# Patient Record
Sex: Male | Born: 1950
Health system: Southern US, Community
[De-identification: ages and names within clinical notes are randomized; demographics above are authoritative.]

## PROBLEM LIST (undated history)

## (undated) DIAGNOSIS — I2510 Atherosclerotic heart disease of native coronary artery without angina pectoris: Secondary | ICD-10-CM

## (undated) DIAGNOSIS — E785 Hyperlipidemia, unspecified: Secondary | ICD-10-CM

## (undated) DIAGNOSIS — I10 Essential (primary) hypertension: Secondary | ICD-10-CM

## (undated) HISTORY — DX: Atherosclerotic heart disease of native coronary artery without angina pectoris: I25.10

## (undated) HISTORY — PX: CORONARY ANGIOPLASTY WITH STENT PLACEMENT: SHX49

## (undated) HISTORY — DX: Essential (primary) hypertension: I10

## (undated) HISTORY — DX: Hyperlipidemia, unspecified: E78.5

---

## 1997-10-13 ENCOUNTER — Inpatient Hospital Stay (HOSPITAL_COMMUNITY): Admission: AD | Admit: 1997-10-13 | Discharge: 1997-10-15 | Payer: Self-pay | Admitting: Cardiology

## 2005-10-30 ENCOUNTER — Ambulatory Visit: Payer: Self-pay | Admitting: Cardiology

## 2005-12-19 ENCOUNTER — Ambulatory Visit: Payer: Self-pay | Admitting: Cardiology

## 2006-02-16 ENCOUNTER — Ambulatory Visit: Payer: Self-pay | Admitting: Cardiology

## 2006-04-18 ENCOUNTER — Ambulatory Visit: Payer: Self-pay | Admitting: Cardiology

## 2006-11-30 ENCOUNTER — Ambulatory Visit: Payer: Self-pay | Admitting: Cardiology

## 2006-11-30 LAB — CONVERTED CEMR LAB
ALT: 25 units/L (ref 0–53)
Albumin: 3.9 g/dL (ref 3.5–5.2)
Alkaline Phosphatase: 57 units/L (ref 39–117)
BUN: 12 mg/dL (ref 6–23)
Bilirubin, Direct: 0.1 mg/dL (ref 0.0–0.3)
Calcium: 9.2 mg/dL (ref 8.4–10.5)
GFR calc Af Amer: 113 mL/min
GFR calc non Af Amer: 93 mL/min
LDL Cholesterol: 83 mg/dL (ref 0–99)
Total CHOL/HDL Ratio: 4.5
VLDL: 21 mg/dL (ref 0–40)

## 2008-02-28 ENCOUNTER — Ambulatory Visit: Payer: Self-pay | Admitting: Cardiology

## 2008-03-02 ENCOUNTER — Ambulatory Visit: Payer: Self-pay | Admitting: Cardiology

## 2008-03-02 LAB — CONVERTED CEMR LAB
ALT: 25 units/L (ref 0–53)
HDL: 36.3 mg/dL — ABNORMAL LOW (ref >39.0)
LDL Cholesterol: 87 mg/dL (ref 0–99)
Total Bilirubin: 0.8 mg/dL (ref 0.3–1.2)
Total CHOL/HDL Ratio: 3.8
VLDL: 14 mg/dL (ref 0–40)

## 2008-12-03 ENCOUNTER — Encounter (INDEPENDENT_AMBULATORY_CARE_PROVIDER_SITE_OTHER): Payer: Self-pay | Admitting: *Deleted

## 2009-04-15 DIAGNOSIS — I10 Essential (primary) hypertension: Secondary | ICD-10-CM | POA: Insufficient documentation

## 2009-04-15 DIAGNOSIS — E785 Hyperlipidemia, unspecified: Secondary | ICD-10-CM

## 2009-04-15 DIAGNOSIS — I2510 Atherosclerotic heart disease of native coronary artery without angina pectoris: Secondary | ICD-10-CM | POA: Insufficient documentation

## 2009-04-16 ENCOUNTER — Ambulatory Visit: Payer: Self-pay | Admitting: Cardiology

## 2009-08-25 ENCOUNTER — Telehealth: Payer: Self-pay | Admitting: Cardiology

## 2009-11-11 ENCOUNTER — Telehealth: Payer: Self-pay | Admitting: Cardiology

## 2010-03-18 ENCOUNTER — Encounter: Payer: Self-pay | Admitting: Cardiology

## 2010-05-10 NOTE — Progress Notes (Signed)
  Medications Added METOPROLOL TARTRATE 25 MG TABS (METOPROLOL TARTRATE) Take one tablet by mouth twice a day            Additional Follow-up for Phone Call Additional follow up Details #2::    pt was wanting to switch to metoprolol tart because of cost puposes is this ok with you....  I called pharmacy and gave pt lopressor 25mg  two times a day 1 year supply Follow-up by: Kem Parkinson,  November 11, 2009 4:28 PM  Additional Follow-up for Phone Call Additional follow up Details #3:: Details for Additional Follow-up Action Taken: dc toprol; begin lopressor 25 mg by mouth two times a day Ferman Hamming, MD, [facility]  November 12, 2009 10:25 AM   New/Updated Medications: METOPROLOL TARTRATE 25 MG TABS (METOPROLOL TARTRATE) Take one tablet by mouth twice a day

## 2010-05-10 NOTE — Assessment & Plan Note (Signed)
Summary: f1y/kfw  Medications Added ASPIRIN 81 MG TBEC (ASPIRIN) Take one tablet by mouth daily      Allergies Added: ! PCN  Visit Type:  1 Yr F/U   History of Present Illness: Mr. Kirk Black is a pleasant gentleman who has a history of coronary disease status post PCI of his LAD in July 1999.  I last saw him in November of 2009. Since then the patient denies any dyspnea on exertion, orthopnea, PND, pedal edema, palpitations, syncope or chest pain.   Current Medications (verified): 1)  Toprol Xl 50 Mg Xr24h-Tab (Metoprolol Succinate) .Marland Kitchen.. 1 Tab By Mouth Once Daily 2)  Simvastatin 40 Mg Tabs (Simvastatin) .... Take One Tablet By Mouth Daily At Bedtime 3)  Lisinopril 20 Mg Tabs (Lisinopril) .... Take One Tablet By Mouth Daily 4)  Aspirin 81 Mg Tbec (Aspirin) .... Take One Tablet By Mouth Daily  Allergies (verified): 1)  ! Pcn  Past History:  Past Medical History: Reviewed history from 04/15/2009 and no changes required. Current Problems:  HYPERTENSION (ICD-401.9) HYPERLIPIDEMIA (ICD-272.4) CAD (ICD-414.00)  Past Surgical History: Reviewed history from 04/15/2009 and no changes required.  status post PCI of his LAD in July 1999  Social History: Reviewed history from 04/15/2009 and no changes required. Tobacco Use - No.   Review of Systems       no fevers or chills, productive cough, hemoptysis, dysphasia, odynophagia, melena, hematochezia, dysuria, hematuria, rash, seizure activity, orthopnea, PND, pedal edema, claudication. Remaining systems are negative.   Vital Signs:  Patient profile:   60 year old male Height:      71 inches Weight:      184 pounds BMI:     25.76 Pulse rate:   64 / minute BP sitting:   128 / 75  (left arm) Cuff size:   large  Vitals Entered By: Cyndi Lennert. Graham-Oliver  Physical Exam  General:  Well-developed well-nourished in no acute distress.  Skin is warm and dry.  HEENT is normal.  Neck is supple. No thyromegaly.  Chest is  clear to auscultation with normal expansion.  Cardiovascular exam is regular rate and rhythm.  Abdominal exam nontender or distended. No masses palpated. Extremities show no edema. neuro grossly intact    EKG  Procedure date:  04/16/2009  Findings:      Sinus rhythm at a rate of 64. Axis normal. No ST changes.  Impression & Recommendations:  Problem # 1:  HYPERTENSION (ICD-401.9)  Blood pressure controlled on present medications. Will continue. Check Bmet. His updated medication list for this problem includes:    Toprol Xl 50 Mg Xr24h-tab (Metoprolol succinate) .Marland Kitchen... 1 tab by mouth once daily    Lisinopril 20 Mg Tabs (Lisinopril) .Marland Kitchen... Take one tablet by mouth daily    Aspirin 81 Mg Tbec (Aspirin) .Marland Kitchen... Take one tablet by mouth daily  His updated medication list for this problem includes:    Toprol Xl 50 Mg Xr24h-tab (Metoprolol succinate) .Marland Kitchen... 1 tab by mouth once daily    Lisinopril 20 Mg Tabs (Lisinopril) .Marland Kitchen... Take one tablet by mouth daily  Problem # 2:  HYPERLIPIDEMIA (ICD-272.4) Continue statin. Check lipids and liver. His updated medication list for this problem includes:    Simvastatin 40 Mg Tabs (Simvastatin) .Marland Kitchen... Take one tablet by mouth daily at bedtime  His updated medication list for this problem includes:    Simvastatin 40 Mg Tabs (Simvastatin) .Marland Kitchen... Take one tablet by mouth daily at bedtime  Problem # 3:  CAD (ICD-414.00) Continue  aspirin, beta blocker, ACE inhibitor and statin. I again discussed a possible stress test with the patient but he is not interested. He understands the risk of undiagnosed coronary disease including myocardial infarction and death. Continue risk factor modification including diet and exercise. His updated medication list for this problem includes:    Toprol Xl 50 Mg Xr24h-tab (Metoprolol succinate) .Marland Kitchen... 1 tab by mouth once daily    Lisinopril 20 Mg Tabs (Lisinopril) .Marland Kitchen... Take one tablet by mouth daily    Aspirin 81 Mg Tbec  (Aspirin) .Marland Kitchen... Take one tablet by mouth daily  Patient Instructions: 1)  Your physician recommends that you schedule a follow-up appointment in: ONE YEAR 2)  Your physician recommends that you return for lab work WHEN FASTING

## 2010-05-10 NOTE — Progress Notes (Signed)
Summary: cardiac clearance for preemployment   Phone Note From Other Clinic   Caller: beverly Reason for Call: Need Referral Information Summary of Call: Per [facility] pt needs clearance for pre employment clearance. ofc [id number] fax [phone]. Pt is in the office needs the clearance today if possible.  Initial call taken by: Edman Circle,  Aug 25, 2009 11:29 AM  Follow-up for Phone Call        pt having a pre-employment physical and they want to know from a cardiac stand point if the pt has any work restrictions regarding his heart. will foward to dr Jens Som for his review Deliah Goody, RN  Aug 25, 2009 12:05 PM   Additional Follow-up for Phone Call Additional follow up Details #1::        No restrictions Ferman Hamming, MD, [facility]  Aug 25, 2009 2:59 PM   this note and last office note faxed Deliah Goody, RN  Aug 25, 2009 3:03 PM

## 2010-08-23 NOTE — Assessment & Plan Note (Signed)
[facility]                            CARDIOLOGY OFFICE NOTE   Kirk Black                      MRN:          [id number]  DATE:02/28/2008                            DOB:          29-Nov-1950    Mr. Kirk Black is a pleasant gentleman who has a history of  coronary disease status post PCI of his LAD in July 1999.  Since I last  saw him on November 30, 2006, he has done well with no dyspnea, chest  pain, palpitations, or syncope.  There is no pedal edema.  He does not  smoke and he is exercising and following a diet.   MEDICATIONS:  1. Toprol 25 mg p.o. daily.  2. Lisinopril 20 mg p.o. daily.  3. Zocor 40 mg p.o. daily.  4. Aspirin 81 mg p.o. daily.   PHYSICAL EXAMINATION:  VITAL SIGNS:  His blood pressure of 115/77 and  his pulse is 66.  His weight is 182 pounds.  HEENT:  Normal.  NECK:  Supple.  There are no bruits noted.  CHEST:  Clear.  CARDIOVASCULAR:  Regular rate and rhythm.  ABDOMEN:  No tenderness.  EXTREMITIES:  No edema.   His electrocardiogram shows sinus rhythm at a rate of 64.  The axis is  normal.  No ST changes noted.   DIAGNOSES:  1. Coronary artery disease - the patient will continue on his aspirin,      statin, beta-blocker, and angiotensin-converting enzyme inhibitor.      He will also continue with diet and exercise.  We discussed again a      positive stress Myoview, but he declined.  2. Hyperlipidemia - he will continue on his statin.  We will check      lipids and liver, adjust as indicated.  Note, also check a BMET      given his angiotensin-converting enzyme inhibitor use.  3. Hypertension - his blood pressure is adequately controlled with his      present medications.   We will see him back in 1 year.     Kirk Frieze Kirk Som, MD, [facility]  Electronically Signed   BSC/MedQ  DD: 02/28/2008  DT: 02/29/2008  Job #: [id number]   cc:   Kirk Black, M.D.

## 2010-08-23 NOTE — Assessment & Plan Note (Signed)
[facility]                            CARDIOLOGY OFFICE NOTE   NAME:Breithaupt, 11/30/2006                  MRN:          [id number]  DATE:11/30/2006                            DOB:          1950-12-30    Mr. Kirk Black is a very pleasant gentleman who has a history of coronary  disease.  He has had prior PCI of his LAD in July 1999.  Since I last  saw him he is doing extremely well.  He denies any dyspnea, chest pain,  palpitations or syncope.  He is walking routinely and trying to follow a  diet.  He is a nonsmoker.   MEDICATIONS:  1. Aspirin 325 mg p.o. daily.  2. Toprol 25 mg daily.  3. Lisinopril 20 mg p.o. daily.  4. Zocor 40 m p.o. daily.   Physical exam today shows a blood pressure of 122/71 and his pulse is  60.  He weighs 185 pounds.  HEENT:  Normal.  NECK:  Supple with no bruits.  CHEST:  Clear.  CARDIOVASCULAR:  Regular rate and rhythm.  ABDOMEN:  Benign.  EXTREMITIES:  No edema.   His electrocardiogram shows a sinus rhythm at a rate of 54.  There are  no ST changes noted.   DIAGNOSES:  1. Coronary artery disease, status post percutaneous coronary      intervention of the left anterior descending.  We will continue      medical therapy as he has had no chest pain or shortness of breath.      We discussed the possibility of a stress Myoview, but he is not      interested in this as on previous visits.  He will continue his      aspirin, statin, beta blocker and ACE inhibitor.  2. Hyperlipidemia.  Will check lipids and liver today and adjust with      goal LDL of less than 70.  3. Hypertension.  His blood pressure is well-controlled on his current      medication.  I will check a BMET to follow his potassium and renal      function.   We will see him back in 1 year.     Madolyn Frieze Jens Som, MD, [facility]  Electronically Signed    BSC/MedQ  DD: 11/30/2006  DT: 12/01/2006  Job #: [id number]   cc:   Windle Guard, M.D.

## 2010-08-26 NOTE — Assessment & Plan Note (Signed)
Kirk Black                              CARDIOLOGY OFFICE NOTE   Kirk Black, Kirk Black                      MRN:          [id number]  DATE:10/30/2005                            DOB:          [birth year]    Kirk Black returns for followup today.  He has a history of coronary  disease, and has had prior percutaneous coronary intervention of his left  anterior descending.  Since I last saw him, he is doing well, with no  dyspnea on exertion, orthopnea, PND, pedal edema, palpitations, pre-syncope,  syncope or chest pain.  He is exercising routinely, walking 30-40 minutes  daily, and also is following a diet.  He does not smoke.   MEDICATIONS:  1.  Aspirin 325 mg p.o. daily.  2.  Lipitor 40 mg p.o. q. day.  3.  Altace 10 mg p.o. q. day.  4.  Toprol 25 mg p.o. q. day.   PHYSICAL EXAMINATION:  BLOOD PRESSURE:  112/68.  PULSE:  65.  NECK:  Supple, no bruits.  CHEST:  Clear.  CARDIOVASCULAR:  Regular rate and rhythm.  ABDOMEN:  Shows no pulsatile masses and no bruits.  EXTREMITIES:  No edema.   His electrocardiogram shows a normal sinus rhythm at a rate of 60.  There  are no ST changes noted.   DIAGNOSIS:  1.  Coronary artery disease, status post percutaneous coronary intervention      of the left anterior descending.  2.  Hyperlipidemia.  3.  Hypertension.   PLAN:  Kirk Black is doing well from a symptomatic standpoint with no  chest pain or shortness of breath.  He will continue on his diet and  exercise, and note, he does not smoke.  For financial reasons, we are  discontinuing his Lipitor and Altace, and we will begin simvastatin 40 mg  p.o. nightly and lisinopril 20 mg p.o. daily.  He will continue on his  aspirin and his  metoprolol ER at 25 mg p.o. q. day.  We will plan to have him return for a  CMET and lipids in 6 weeks.  I will see him back in 12 months.                              Madolyn Frieze Jens Som, MD, [facility]    BSC/MedQ  DD:  10/30/2005  DT:  10/30/2005  Job #:  [id number]   cc:   Windle Guard, MD

## 2011-01-03 ENCOUNTER — Other Ambulatory Visit: Payer: Self-pay | Admitting: Cardiology

## 2011-01-05 ENCOUNTER — Other Ambulatory Visit: Payer: Self-pay | Admitting: Cardiology

## 2011-04-06 ENCOUNTER — Other Ambulatory Visit: Payer: Self-pay | Admitting: Cardiology

## 2011-05-01 ENCOUNTER — Encounter: Payer: Self-pay | Admitting: *Deleted

## 2011-05-05 ENCOUNTER — Ambulatory Visit: Payer: Self-pay | Admitting: Cardiology

## 2011-05-08 ENCOUNTER — Other Ambulatory Visit: Payer: Self-pay | Admitting: Cardiology

## 2011-05-08 MED ORDER — METOPROLOL TARTRATE 25 MG PO TABS: 25.0000 mg | ORAL_TABLET | Freq: Two times a day (BID) | ORAL | Status: DC

## 2011-06-16 ENCOUNTER — Ambulatory Visit: Payer: Self-pay | Admitting: Cardiology

## 2011-08-04 ENCOUNTER — Encounter: Payer: Self-pay | Admitting: Cardiology

## 2011-08-04 ENCOUNTER — Ambulatory Visit (INDEPENDENT_AMBULATORY_CARE_PROVIDER_SITE_OTHER): Payer: Self-pay | Admitting: Cardiology

## 2011-08-04 VITALS — BP 124/70 | HR 56 | Ht 71.0 in | Wt 185.0 lb

## 2011-08-04 DIAGNOSIS — I10 Essential (primary) hypertension: Secondary | ICD-10-CM

## 2011-08-04 DIAGNOSIS — I2510 Atherosclerotic heart disease of native coronary artery without angina pectoris: Secondary | ICD-10-CM

## 2011-08-04 DIAGNOSIS — E785 Hyperlipidemia, unspecified: Secondary | ICD-10-CM

## 2011-08-04 NOTE — Progress Notes (Signed)
   HPI: Mr. Kirk Black is a pleasant gentleman who has a history of coronary disease status post PCI of his LAD in July 1999. Patient has declined function studies in the past. I last saw him in Jan 2011. Since then the patient denies any dyspnea on exertion, orthopnea, PND, pedal edema, palpitations, syncope or chest pain.  Current Outpatient Prescriptions  Medication Sig Dispense Refill  . aspirin 81 MG tablet Take 160 mg by mouth daily.      Marland Kitchen lisinopril (PRINIVIL,ZESTRIL) 20 MG tablet TAKE ONE TABLET BY MOUTH DAILY  90 tablet  1  . metoprolol tartrate (LOPRESSOR) 25 MG tablet Take 1 tablet (25 mg total) by mouth 2 (two) times daily.  60 tablet  2  . simvastatin (ZOCOR) 40 MG tablet TAKE  ONE TABLET BY MOUTH DAILY AT BEDTIME  30 tablet  12     Past Medical History  Diagnosis Date  . Coronary artery disease   . Hypertension   . Hyperlipidemia     Past Surgical History  Procedure Date  . Coronary angioplasty with stent placement     percutaneous coronary intervention (PCI) of LAD    History   Social History  . Marital Status: Single    Spouse Name: N/A    Number of Children: N/A  . Years of Education: N/A   Occupational History  . Not on file.   Social History Main Topics  . Smoking status: Never Smoker   . Smokeless tobacco: Not on file  . Alcohol Use: Not on file  . Drug Use: Not on file  . Sexually Active: Not on file   Other Topics Concern  . Not on file   Social History Narrative  . No narrative on file    ROS: no fevers or chills, productive cough, hemoptysis, dysphasia, odynophagia, melena, hematochezia, dysuria, hematuria, rash, seizure activity, orthopnea, PND, pedal edema, claudication. Remaining systems are negative.  Physical Exam: Well-developed well-nourished in no acute distress.  Skin is warm and dry.  HEENT is normal.  Neck is supple.  Chest is clear to auscultation with normal expansion.  Cardiovascular exam is regular rate and rhythm.   Abdominal exam nontender or distended. No masses palpated. Extremities show no edema. neuro grossly intact  ECG sinus bradycardia at a rate of 56. No significant ST changes.

## 2011-08-04 NOTE — Assessment & Plan Note (Signed)
Continue statin. Check lipids and liver. 

## 2011-08-04 NOTE — Assessment & Plan Note (Signed)
Blood pressure controlled. Continue present medications. Check potassium and renal function. 

## 2011-08-04 NOTE — Assessment & Plan Note (Signed)
Continue aspirin and statin. Continue risk factor modification. Patient declines a functional study.  

## 2011-08-04 NOTE — Patient Instructions (Signed)
Your physician wants you to follow-up in: ONE YEAR WITH DR Shelda Pal will receive a reminder letter in the mail two months in advance. If you don't receive a letter, please call our office to schedule the follow-up appointment.   Your physician recommends that you WHEN FASTING

## 2011-08-07 ENCOUNTER — Other Ambulatory Visit: Payer: Self-pay | Admitting: Cardiology

## 2011-09-06 ENCOUNTER — Encounter: Payer: Self-pay | Admitting: *Deleted

## 2011-09-06 ENCOUNTER — Other Ambulatory Visit (INDEPENDENT_AMBULATORY_CARE_PROVIDER_SITE_OTHER): Payer: Self-pay

## 2011-09-06 DIAGNOSIS — I2510 Atherosclerotic heart disease of native coronary artery without angina pectoris: Secondary | ICD-10-CM

## 2011-09-06 DIAGNOSIS — E785 Hyperlipidemia, unspecified: Secondary | ICD-10-CM

## 2011-09-06 LAB — LIPID PANEL
Cholesterol: 133 mg/dL (ref 0–200)
LDL Cholesterol: 78 mg/dL (ref 0–99)
Total CHOL/HDL Ratio: 3
Triglycerides: 65 mg/dL (ref 0.0–149.0)
VLDL: 13 mg/dL (ref 0.0–40.0)

## 2011-09-06 LAB — HEPATIC FUNCTION PANEL
ALT: 22 U/L (ref 0–53)
AST: 25 U/L (ref 0–37)
Bilirubin, Direct: 0.1 mg/dL (ref 0.0–0.3)
Total Bilirubin: 0.6 mg/dL (ref 0.3–1.2)

## 2012-01-06 ENCOUNTER — Other Ambulatory Visit: Payer: Self-pay | Admitting: Cardiology

## 2012-08-07 ENCOUNTER — Other Ambulatory Visit: Payer: Self-pay | Admitting: Cardiology

## 2012-09-19 ENCOUNTER — Encounter: Payer: Self-pay | Admitting: Cardiology

## 2012-11-06 ENCOUNTER — Other Ambulatory Visit: Payer: Self-pay | Admitting: *Deleted

## 2012-11-06 MED ORDER — LISINOPRIL 20 MG PO TABS: ORAL_TABLET | ORAL | Status: DC

## 2013-01-11 ENCOUNTER — Other Ambulatory Visit: Payer: Self-pay | Admitting: Cardiology

## 2013-01-14 ENCOUNTER — Other Ambulatory Visit: Payer: Self-pay

## 2013-01-14 MED ORDER — LISINOPRIL 20 MG PO TABS: ORAL_TABLET | ORAL | Status: DC

## 2013-01-15 ENCOUNTER — Ambulatory Visit (INDEPENDENT_AMBULATORY_CARE_PROVIDER_SITE_OTHER): Payer: Self-pay | Admitting: Cardiology

## 2013-01-15 ENCOUNTER — Encounter: Payer: Self-pay | Admitting: Cardiology

## 2013-01-15 VITALS — BP 120/80 | HR 73 | Ht 71.0 in | Wt 183.8 lb

## 2013-01-15 DIAGNOSIS — I2510 Atherosclerotic heart disease of native coronary artery without angina pectoris: Secondary | ICD-10-CM

## 2013-01-15 NOTE — Assessment & Plan Note (Signed)
Blood pressure controlled. Continue present medications. Check potassium and renal function. 

## 2013-01-15 NOTE — Assessment & Plan Note (Signed)
Continue aspirin and statin. Continue risk factor modification. Patient declines a functional study.

## 2013-01-15 NOTE — Progress Notes (Signed)
      HPI: Kirk Black is a pleasant gentleman who has a history of coronary disease status post PCI of his LAD in July 1999 for fu. Patient has declined function studies in the past. I last saw him in April 2013. Since then the patient denies any dyspnea on exertion, orthopnea, PND, pedal edema, palpitations, syncope or chest pain.   Current Outpatient Prescriptions  Medication Sig Dispense Refill  . aspirin 81 MG tablet Take 81 mg by mouth daily.       Marland Kitchen lisinopril (PRINIVIL,ZESTRIL) 20 MG tablet TAKE ONE TABLET BY MOUTH DAILY  90 tablet  1  . metoprolol tartrate (LOPRESSOR) 25 MG tablet TAKE ONE TABLET TWICE DAILY  60 tablet  11  . simvastatin (ZOCOR) 40 MG tablet TAKE ONE TABLET BY MOUTH DAILY AT BEDTIME  90 tablet  0   No current facility-administered medications for this visit.     Past Medical History  Diagnosis Date  . Coronary artery disease   . Hypertension   . Hyperlipidemia     Past Surgical History  Procedure Laterality Date  . Coronary angioplasty with stent placement      percutaneous coronary intervention (PCI) of LAD    History   Social History  . Marital Status: Single    Spouse Name: N/A    Number of Children: N/A  . Years of Education: N/A   Occupational History  . Not on file.   Social History Main Topics  . Smoking status: Never Smoker   . Smokeless tobacco: Not on file  . Alcohol Use: Not on file  . Drug Use: Not on file  . Sexual Activity: Not on file   Other Topics Concern  . Not on file   Social History Narrative  . No narrative on file    ROS: no fevers or chills, productive cough, hemoptysis, dysphasia, odynophagia, melena, hematochezia, dysuria, hematuria, rash, seizure activity, orthopnea, PND, pedal edema, claudication. Remaining systems are negative.  Physical Exam: Well-developed well-nourished in no acute distress.  Skin is warm and dry.  HEENT is normal.  Neck is supple.  Chest is clear to auscultation with  normal expansion.  Cardiovascular exam is regular rate and rhythm.  Abdominal exam nontender or distended. No masses palpated. Extremities show no edema. neuro grossly intact  ECG sinus rhythm with no ST changes.

## 2013-01-15 NOTE — Assessment & Plan Note (Signed)
Continue statin.check lipids and liver. 

## 2013-01-15 NOTE — Patient Instructions (Signed)
Your physician wants you to follow-up in: ONE YEAR WITH DR Shelda Pal will receive a reminder letter in the mail two months in advance. If you don't receive a letter, please call our office to schedule the follow-up appointment.   Your physician recommends that you return for lab work WHEN ABLE= DO NOT EAT PRIOR TO LAB WORK

## 2013-02-18 ENCOUNTER — Encounter: Payer: Self-pay | Admitting: *Deleted

## 2013-02-18 ENCOUNTER — Encounter (INDEPENDENT_AMBULATORY_CARE_PROVIDER_SITE_OTHER): Payer: Self-pay

## 2013-02-18 ENCOUNTER — Other Ambulatory Visit (INDEPENDENT_AMBULATORY_CARE_PROVIDER_SITE_OTHER): Payer: Self-pay

## 2013-02-18 DIAGNOSIS — I2510 Atherosclerotic heart disease of native coronary artery without angina pectoris: Secondary | ICD-10-CM

## 2013-02-18 LAB — BASIC METABOLIC PANEL
BUN: 12 mg/dL (ref 6–23)
Calcium: 8.6 mg/dL (ref 8.4–10.5)
Creatinine, Ser: 0.9 mg/dL (ref 0.4–1.5)
GFR: 94.99 mL/min (ref >60.00)
Glucose, Bld: 102 mg/dL — ABNORMAL HIGH (ref 70–99)

## 2013-02-18 LAB — HEPATIC FUNCTION PANEL
ALT: 25 U/L (ref 0–53)
AST: 25 U/L (ref 0–37)
Bilirubin, Direct: 0.1 mg/dL (ref 0.0–0.3)
Total Bilirubin: 0.6 mg/dL (ref 0.3–1.2)
Total Protein: 6.3 g/dL (ref 6.0–8.3)

## 2013-02-18 LAB — LIPID PANEL
Cholesterol: 132 mg/dL (ref 0–200)
HDL: 41.5 mg/dL (ref >39.00)

## 2013-04-12 ENCOUNTER — Other Ambulatory Visit: Payer: Self-pay | Admitting: Cardiology

## 2013-08-19 ENCOUNTER — Other Ambulatory Visit: Payer: Self-pay | Admitting: Cardiology

## 2013-10-23 ENCOUNTER — Other Ambulatory Visit: Payer: Self-pay | Admitting: Cardiology

## 2013-10-24 NOTE — Telephone Encounter (Signed)
Rx refill sent to patient pharmacy   

## 2013-11-13 ENCOUNTER — Other Ambulatory Visit: Payer: Self-pay | Admitting: Cardiology

## 2014-02-20 ENCOUNTER — Other Ambulatory Visit: Payer: Self-pay | Admitting: *Deleted

## 2014-02-20 MED ORDER — LISINOPRIL 20 MG PO TABS: ORAL_TABLET | ORAL | Status: DC

## 2014-03-31 ENCOUNTER — Other Ambulatory Visit: Payer: Self-pay | Admitting: *Deleted

## 2014-03-31 MED ORDER — LISINOPRIL 20 MG PO TABS: ORAL_TABLET | ORAL | Status: DC

## 2014-04-24 ENCOUNTER — Other Ambulatory Visit: Payer: Self-pay | Admitting: *Deleted

## 2014-04-24 MED ORDER — SIMVASTATIN 40 MG PO TABS: 40.0000 mg | ORAL_TABLET | Freq: Every day | ORAL | Status: DC

## 2014-05-12 NOTE — Progress Notes (Signed)
      HPI: FU coronary disease status post PCI of his LAD in July 1999. Patient has declined function studies in the past. Since I last saw him, the patient denies any dyspnea on exertion, orthopnea, PND, pedal edema, palpitations, syncope or chest pain.   Current Outpatient Prescriptions  Medication Sig Dispense Refill  . aspirin 81 MG tablet Take 81 mg by mouth daily.     Marland Kitchen. lisinopril (PRINIVIL,ZESTRIL) 20 MG tablet TAKE ONE TABLET BY MOUTH ONE TIME DAILY 30 tablet 1  . metoprolol tartrate (LOPRESSOR) 25 MG tablet TAKE ONE TABLET TWICE DAILY 60 tablet 6  . simvastatin (ZOCOR) 40 MG tablet Take 1 tablet (40 mg total) by mouth at bedtime. 30 tablet 0   No current facility-administered medications for this visit.     Past Medical History  Diagnosis Date  . Coronary artery disease   . Hypertension   . Hyperlipidemia     Past Surgical History  Procedure Laterality Date  . Coronary angioplasty with stent placement      percutaneous coronary intervention (PCI) of LAD    History   Social History  . Marital Status: Single    Spouse Name: N/A    Number of Children: N/A  . Years of Education: N/A   Occupational History  . Not on file.   Social History Main Topics  . Smoking status: Never Smoker   . Smokeless tobacco: Not on file  . Alcohol Use: Not on file  . Drug Use: Not on file  . Sexual Activity: Not on file   Other Topics Concern  . Not on file   Social History Narrative    ROS: no fevers or chills, productive cough, hemoptysis, dysphasia, odynophagia, melena, hematochezia, dysuria, hematuria, rash, seizure activity, orthopnea, PND, pedal edema, claudication. Remaining systems are negative.  Physical Exam: Well-developed well-nourished in no acute distress.  Skin is warm and dry.  HEENT is normal.  Neck is supple.  Chest is clear to auscultation with normal expansion.  Cardiovascular exam is regular rate and rhythm.  Abdominal exam nontender or distended.  No masses palpated. Extremities show no edema. neuro grossly intact  ECG sinus rhythm at a rate of 64. No ST changes.

## 2014-05-14 ENCOUNTER — Encounter: Payer: Self-pay | Admitting: Cardiology

## 2014-05-14 ENCOUNTER — Ambulatory Visit (INDEPENDENT_AMBULATORY_CARE_PROVIDER_SITE_OTHER): Payer: 59 | Admitting: Cardiology

## 2014-05-14 DIAGNOSIS — I2510 Atherosclerotic heart disease of native coronary artery without angina pectoris: Secondary | ICD-10-CM

## 2014-05-14 NOTE — Assessment & Plan Note (Signed)
Continue statin. Check lipids and liver. 

## 2014-05-14 NOTE — Assessment & Plan Note (Signed)
Continue aspirin and statin. Declines functional study. 

## 2014-05-14 NOTE — Assessment & Plan Note (Signed)
Blood pressure controlled. Continue present medications. Check potassium and renal function. 

## 2014-05-14 NOTE — Patient Instructions (Signed)
Your physician wants you to follow-up in: ONE YEAR WITH DR CRENSHAW You will receive a reminder letter in the mail two months in advance. If you don't receive a letter, please call our office to schedule the follow-up appointment.   Your physician recommends that you return for lab work WHEN FASTING 

## 2014-05-22 ENCOUNTER — Other Ambulatory Visit: Payer: Self-pay | Admitting: *Deleted

## 2014-05-22 MED ORDER — LISINOPRIL 20 MG PO TABS: ORAL_TABLET | ORAL | Status: DC

## 2014-05-22 MED ORDER — SIMVASTATIN 40 MG PO TABS: 40.0000 mg | ORAL_TABLET | Freq: Every day | ORAL | Status: DC

## 2014-05-25 ENCOUNTER — Other Ambulatory Visit: Payer: Self-pay | Admitting: Nurse Practitioner

## 2014-05-25 LAB — BASIC METABOLIC PANEL WITH GFR
BUN: 11 mg/dL (ref 6–23)
CALCIUM: 9 mg/dL (ref 8.4–10.5)
CO2: 29 mEq/L (ref 19–32)
CREATININE: 0.83 mg/dL (ref 0.50–1.35)
Chloride: 106 mEq/L (ref 96–112)
Glucose, Bld: 101 mg/dL — ABNORMAL HIGH (ref 70–99)
Potassium: 4.1 mEq/L (ref 3.5–5.3)
Sodium: 141 mEq/L (ref 135–145)

## 2014-05-25 LAB — HEPATIC FUNCTION PANEL
ALBUMIN: 3.9 g/dL (ref 3.5–5.2)
ALK PHOS: 80 U/L (ref 39–117)
ALT: 27 U/L (ref 0–53)
AST: 25 U/L (ref 0–37)
BILIRUBIN DIRECT: 0.1 mg/dL (ref 0.0–0.3)
BILIRUBIN INDIRECT: 0.4 mg/dL (ref 0.2–1.2)
Total Bilirubin: 0.5 mg/dL (ref 0.2–1.2)
Total Protein: 6 g/dL (ref 6.0–8.3)

## 2014-05-25 LAB — LIPID PANEL
Cholesterol: 117 mg/dL (ref 0–200)
HDL: 30 mg/dL — AB (ref >39)
LDL Cholesterol: 73 mg/dL (ref 0–99)
TRIGLYCERIDES: 69 mg/dL (ref <150)
Total CHOL/HDL Ratio: 3.9 Ratio
VLDL: 14 mg/dL (ref 0–40)

## 2014-05-27 ENCOUNTER — Encounter: Payer: Self-pay | Admitting: *Deleted

## 2014-10-22 ENCOUNTER — Other Ambulatory Visit: Payer: Self-pay | Admitting: *Deleted

## 2014-10-22 MED ORDER — METOPROLOL TARTRATE 25 MG PO TABS: 25.0000 mg | ORAL_TABLET | Freq: Two times a day (BID) | ORAL | Status: DC

## 2015-04-26 ENCOUNTER — Other Ambulatory Visit: Payer: Self-pay

## 2015-04-26 MED ORDER — METOPROLOL TARTRATE 25 MG PO TABS: 25.0000 mg | ORAL_TABLET | Freq: Two times a day (BID) | ORAL | Status: DC

## 2015-04-26 NOTE — Telephone Encounter (Signed)
Lewayne BuntingBrian S Crenshaw, MD at 05/12/2014 5:54 PM  metoprolol tartrate (LOPRESSOR) 25 MG tabletTAKE ONE TABLET TWICE DAILY Patient Instructions     Your physician wants you to follow-up in: ONE YEAR WITH DR Shelda PalRENSHAW You will receive a reminder letter in the mail two months in advance. If you don't receive a letter, please call our office to schedule the follow-up appointment.   Your physician recommends that you return for lab work WHEN FASTING

## 2015-05-24 ENCOUNTER — Other Ambulatory Visit: Payer: Self-pay | Admitting: *Deleted

## 2015-05-24 MED ORDER — LISINOPRIL 20 MG PO TABS: ORAL_TABLET | ORAL | Status: DC

## 2015-05-24 MED ORDER — SIMVASTATIN 40 MG PO TABS: 40.0000 mg | ORAL_TABLET | Freq: Every day | ORAL | Status: DC

## 2015-05-26 ENCOUNTER — Other Ambulatory Visit: Payer: Self-pay | Admitting: *Deleted

## 2015-05-26 MED ORDER — METOPROLOL TARTRATE 25 MG PO TABS: 25.0000 mg | ORAL_TABLET | Freq: Two times a day (BID) | ORAL | Status: DC

## 2015-06-14 NOTE — Progress Notes (Signed)
      HPI: FU coronary disease status post PCI of his LAD in July 1999. Patient has declined function studies in the past. Since I last saw him,   Current Outpatient Prescriptions  Medication Sig Dispense Refill  . aspirin 81 MG tablet Take 81 mg by mouth daily.     Marland Kitchen. lisinopril (PRINIVIL,ZESTRIL) 20 MG tablet TAKE ONE TABLET BY MOUTH ONE TIME DAILY 30 tablet 0  . metoprolol tartrate (LOPRESSOR) 25 MG tablet Take 1 tablet (25 mg total) by mouth 2 (two) times daily. 60 tablet 0  . simvastatin (ZOCOR) 40 MG tablet Take 1 tablet (40 mg total) by mouth at bedtime. 30 tablet 0   No current facility-administered medications for this visit.     Past Medical History  Diagnosis Date  . Coronary artery disease   . Hypertension   . Hyperlipidemia     Past Surgical History  Procedure Laterality Date  . Coronary angioplasty with stent placement      percutaneous coronary intervention (PCI) of LAD    Social History   Social History  . Marital Status: Single    Spouse Name: N/A  . Number of Children: N/A  . Years of Education: N/A   Occupational History  . Not on file.   Social History Main Topics  . Smoking status: Never Smoker   . Smokeless tobacco: Not on file  . Alcohol Use: Not on file  . Drug Use: Not on file  . Sexual Activity: Not on file   Other Topics Concern  . Not on file   Social History Narrative    Family History  Problem Relation Age of Onset  . Stroke Father     CVA  . Heart attack Father   . Heart attack Mother     ROS: no fevers or chills, productive cough, hemoptysis, dysphasia, odynophagia, melena, hematochezia, dysuria, hematuria, rash, seizure activity, orthopnea, PND, pedal edema, claudication. Remaining systems are negative.  Physical Exam: Well-developed well-nourished in no acute distress.  Skin is warm and dry.  HEENT is normal.  Neck is supple.  Chest is clear to auscultation with normal expansion.  Cardiovascular exam is regular  rate and rhythm.  Abdominal exam nontender or distended. No masses palpated. Extremities show no edema. neuro grossly intact  ECG Sinus rhythm with no ST changes.

## 2015-06-22 ENCOUNTER — Other Ambulatory Visit: Payer: Self-pay | Admitting: Cardiology

## 2015-06-22 ENCOUNTER — Other Ambulatory Visit: Payer: Self-pay | Admitting: *Deleted

## 2015-06-22 ENCOUNTER — Encounter: Payer: Self-pay | Admitting: Cardiology

## 2015-06-22 ENCOUNTER — Ambulatory Visit (INDEPENDENT_AMBULATORY_CARE_PROVIDER_SITE_OTHER): Payer: 59 | Admitting: Cardiology

## 2015-06-22 VITALS — BP 122/74 | HR 60 | Ht 70.0 in | Wt 168.0 lb

## 2015-06-22 DIAGNOSIS — I10 Essential (primary) hypertension: Secondary | ICD-10-CM | POA: Diagnosis not present

## 2015-06-22 DIAGNOSIS — I2510 Atherosclerotic heart disease of native coronary artery without angina pectoris: Secondary | ICD-10-CM

## 2015-06-22 MED ORDER — SIMVASTATIN 40 MG PO TABS: 40.0000 mg | ORAL_TABLET | Freq: Every day | ORAL | Status: DC

## 2015-06-22 NOTE — Assessment & Plan Note (Addendum)
Declines high-dose statins. Continue Zocor 40 mg daily. Check lipids and liver.

## 2015-06-22 NOTE — Patient Instructions (Signed)
Labwork:  Your physician recommends that you return for lab work PRIOR TO EATING  Follow-Up:  Your physician wants you to follow-up in: ONE YEAR WITH DR Shelda PalRENSHAW You will receive a reminder letter in the mail two months in advance. If you don't receive a letter, please call our office to schedule the follow-up appointment.

## 2015-06-22 NOTE — Assessment & Plan Note (Signed)
Blood pressure controlled. Continue present medications. Check potassium and renal function. 

## 2015-06-22 NOTE — Assessment & Plan Note (Signed)
Continue aspirin and statin. Declines functional study.

## 2015-06-22 NOTE — Telephone Encounter (Signed)
REFILL 

## 2015-06-25 ENCOUNTER — Encounter: Payer: Self-pay | Admitting: *Deleted

## 2015-06-25 LAB — HEPATIC FUNCTION PANEL
ALBUMIN: 4.1 g/dL (ref 3.6–5.1)
ALK PHOS: 58 U/L (ref 40–115)
ALT: 16 U/L (ref 9–46)
AST: 19 U/L (ref 10–35)
BILIRUBIN DIRECT: 0.1 mg/dL (ref <=0.2)
BILIRUBIN TOTAL: 0.7 mg/dL (ref 0.2–1.2)
Indirect Bilirubin: 0.6 mg/dL (ref 0.2–1.2)
Total Protein: 6.1 g/dL (ref 6.1–8.1)

## 2015-06-25 LAB — BASIC METABOLIC PANEL
BUN: 12 mg/dL (ref 7–25)
CHLORIDE: 107 mmol/L (ref 98–110)
CO2: 26 mmol/L (ref 20–31)
CREATININE: 0.8 mg/dL (ref 0.70–1.25)
Calcium: 8.9 mg/dL (ref 8.6–10.3)
GLUCOSE: 102 mg/dL — AB (ref 65–99)
Potassium: 4.4 mmol/L (ref 3.5–5.3)
Sodium: 142 mmol/L (ref 135–146)

## 2015-06-25 LAB — LIPID PANEL
Cholesterol: 138 mg/dL (ref 125–200)
HDL: 40 mg/dL (ref >=40)
LDL CALC: 83 mg/dL (ref <130)
Total CHOL/HDL Ratio: 3.5 Ratio (ref <=5.0)
Triglycerides: 73 mg/dL (ref <150)
VLDL: 15 mg/dL (ref <30)

## 2016-04-18 ENCOUNTER — Ambulatory Visit: Payer: 59 | Admitting: Cardiology

## 2016-06-15 ENCOUNTER — Other Ambulatory Visit: Payer: Self-pay | Admitting: Cardiology

## 2016-12-22 NOTE — Progress Notes (Signed)
      HPI: FU coronary disease status post PCI of his LAD in July 1999. Patient has declined function studies in the past. Since I last saw him, the patient denies any dyspnea on exertion, orthopnea, PND, pedal edema, palpitations, syncope or chest pain.   Current Outpatient Prescriptions  Medication Sig Dispense Refill  . aspirin 81 MG tablet Take 81 mg by mouth daily.     Marland Kitchen lisinopril (PRINIVIL,ZESTRIL) 20 MG tablet TAKE ONE TABLET BY MOUTH ONE TIME DAILY 90 tablet 3  . metoprolol tartrate (LOPRESSOR) 25 MG tablet TAKE 1 TABLET (25 MG TOTAL) BY MOUTH 2 (TWO) TIMES DAILY. 180 tablet 3  . simvastatin (ZOCOR) 40 MG tablet TAKE 1 TABLET (40 MG TOTAL) BY MOUTH AT BEDTIME. 90 tablet 3   No current facility-administered medications for this visit.      Past Medical History:  Diagnosis Date  . Coronary artery disease   . Hyperlipidemia   . Hypertension     Past Surgical History:  Procedure Laterality Date  . CORONARY ANGIOPLASTY WITH STENT PLACEMENT     percutaneous coronary intervention (PCI) of LAD    Social History   Social History  . Marital status: Single    Spouse name: N/A  . Number of children: N/A  . Years of education: N/A   Occupational History  . Not on file.   Social History Main Topics  . Smoking status: Never Smoker  . Smokeless tobacco: Never Used  . Alcohol use No  . Drug use: No  . Sexual activity: Not on file   Other Topics Concern  . Not on file   Social History Narrative  . No narrative on file    Family History  Problem Relation Age of Onset  . Stroke Father        CVA  . Heart attack Father   . Heart attack Mother     ROS: no fevers or chills, productive cough, hemoptysis, dysphasia, odynophagia, melena, hematochezia, dysuria, hematuria, rash, seizure activity, orthopnea, PND, pedal edema, claudication. Remaining systems are negative.  Physical Exam: Well-developed well-nourished in no acute distress.  Skin is warm and dry.  HEENT  is normal.  Neck is supple. No bruits. Chest is clear to auscultation with normal expansion.  Cardiovascular exam is regular rate and rhythm.  Abdominal exam nontender or distended. No masses palpated. Extremities show no edema. neuro grossly intact  ECG- sinus bradycardia, no ST changes. personally reviewed  A/P  1 Coronary artery disease-continue aspirin and statin. Patient has not had chest pain. He has declined follow-up functional study.   2 hypertension-blood pressure is controlled. Continue present medications. Check potassium and renal function.  3 hyperlipidemia-continue Zocor 40 mg daily. Check lipids and liver. Patient declines high-dose statin.  Olga Millers, MD

## 2017-01-05 ENCOUNTER — Encounter: Payer: Self-pay | Admitting: Cardiology

## 2017-01-05 ENCOUNTER — Ambulatory Visit (INDEPENDENT_AMBULATORY_CARE_PROVIDER_SITE_OTHER): Payer: 59 | Admitting: Cardiology

## 2017-01-05 VITALS — BP 110/86 | HR 57 | Ht 70.0 in | Wt 183.4 lb

## 2017-01-05 DIAGNOSIS — I10 Essential (primary) hypertension: Secondary | ICD-10-CM | POA: Diagnosis not present

## 2017-01-05 DIAGNOSIS — I2510 Atherosclerotic heart disease of native coronary artery without angina pectoris: Secondary | ICD-10-CM

## 2017-01-05 DIAGNOSIS — E785 Hyperlipidemia, unspecified: Secondary | ICD-10-CM

## 2017-01-05 NOTE — Patient Instructions (Signed)
Medication Instructions: Continue current medications  Labwork: Fasting Lipids Liver and BMPF  Procedures/Testing: None Ordered  Follow-Up: Your physician wants you to follow-up in: 1 Year. You will receive a reminder letter in the mail two months in advance. If you don't receive a letter, please call our office to schedule the follow-up appointment.   Any Additional Special Instructions Will Be Listed Below (If Applicable).     If you need a refill on your cardiac medications before your next appointment, please call your pharmacy.

## 2017-01-08 ENCOUNTER — Emergency Department (HOSPITAL_COMMUNITY)
Admission: EM | Admit: 2017-01-08 | Discharge: 2017-01-08 | Disposition: A | Discharge status: Home / Self Care | Payer: 59 | Attending: Physician Assistant | Admitting: Physician Assistant

## 2017-01-08 ENCOUNTER — Emergency Department (HOSPITAL_COMMUNITY): Payer: 59

## 2017-01-08 ENCOUNTER — Encounter (HOSPITAL_COMMUNITY): Payer: Self-pay

## 2017-01-08 DIAGNOSIS — I10 Essential (primary) hypertension: Secondary | ICD-10-CM | POA: Diagnosis not present

## 2017-01-08 DIAGNOSIS — I2510 Atherosclerotic heart disease of native coronary artery without angina pectoris: Secondary | ICD-10-CM | POA: Diagnosis not present

## 2017-01-08 DIAGNOSIS — Z955 Presence of coronary angioplasty implant and graft: Secondary | ICD-10-CM | POA: Insufficient documentation

## 2017-01-08 DIAGNOSIS — Z7982 Long term (current) use of aspirin: Secondary | ICD-10-CM | POA: Insufficient documentation

## 2017-01-08 DIAGNOSIS — Z79899 Other long term (current) drug therapy: Secondary | ICD-10-CM | POA: Diagnosis not present

## 2017-01-08 DIAGNOSIS — R42 Dizziness and giddiness: Secondary | ICD-10-CM | POA: Diagnosis not present

## 2017-01-08 LAB — BASIC METABOLIC PANEL
Anion gap: 8 (ref 5–15)
BUN: 11 mg/dL (ref 6–20)
CHLORIDE: 105 mmol/L (ref 101–111)
CO2: 22 mmol/L (ref 22–32)
CREATININE: 0.88 mg/dL (ref 0.61–1.24)
Calcium: 8.7 mg/dL — ABNORMAL LOW (ref 8.9–10.3)
GFR calc Af Amer: >60 mL/min (ref >60)
GLUCOSE: 119 mg/dL — AB (ref 65–99)
POTASSIUM: 3.8 mmol/L (ref 3.5–5.1)
SODIUM: 135 mmol/L (ref 135–145)

## 2017-01-08 LAB — CBC
HEMATOCRIT: 39.1 % (ref 39.0–52.0)
Hemoglobin: 13.2 g/dL (ref 13.0–17.0)
MCH: 29.2 pg (ref 26.0–34.0)
MCHC: 33.8 g/dL (ref 30.0–36.0)
MCV: 86.5 fL (ref 78.0–100.0)
PLATELETS: 173 10*3/uL (ref 150–400)
RBC: 4.52 MIL/uL (ref 4.22–5.81)
RDW: 12.8 % (ref 11.5–15.5)
WBC: 11.8 10*3/uL — AB (ref 4.0–10.5)

## 2017-01-08 MED ORDER — SODIUM CHLORIDE 0.9 % IV BOLUS (SEPSIS)
1000.0000 mL | Freq: Once | INTRAVENOUS | Status: AC
Start: 2017-01-08 — End: 2017-01-08
  Administered 2017-01-08: 1000 mL via INTRAVENOUS

## 2017-01-08 NOTE — ED Provider Notes (Signed)
[facility]-EMERGENCY DEPT Provider Note   CSN: [id number] Arrival date & time: 01/08/17  1352     History   Chief Complaint Chief Complaint  Patient presents with  . Dizziness    HPI Kirk Black is a 66 y.o. male.  HPI   Patient 66 year old male past medical history significant for CAD hypertension hyperlipidemia presenting today with orthostatic dizziness. Patient reports that he went to a meeting today and when he stood up he abruptly felt unsteady. Denies chest pain shortness of breath. He just reports he was unsteady. He sat down and felt much better. He stood up another time and felt the same symptoms again. Patient reports a rocking him to come to an EMS Center where he felt completely fine. They brought him here to the emergency department. He reports the symptoms are completely gone now. Orthostatic vital signs were checked and he said he was asymptomatic during that time.  Wife reports that he has had a cough for the last 2 weeks. Otherwise report eating and drinking normally. No changes in medications, and no symptoms  Past Medical History:  Diagnosis Date  . Coronary artery disease   . Hyperlipidemia   . Hypertension     Patient Active Problem List   Diagnosis Date Noted  . HYPERLIPIDEMIA 04/15/2009  . Essential hypertension 04/15/2009  . Coronary atherosclerosis 04/15/2009    Past Surgical History:  Procedure Laterality Date  . CORONARY ANGIOPLASTY WITH STENT PLACEMENT     percutaneous coronary intervention (PCI) of LAD       Home Medications    Prior to Admission medications   Medication Sig Start Date End Date Taking? Authorizing Provider  aspirin 81 MG tablet Take 81 mg by mouth daily.     [provider]  lisinopril (PRINIVIL,ZESTRIL) 20 MG tablet TAKE ONE TABLET BY MOUTH ONE TIME DAILY 06/15/16   Lewayne Bunting, MD  metoprolol tartrate (LOPRESSOR) 25 MG tablet TAKE 1 TABLET (25 MG TOTAL) BY MOUTH 2 (TWO) TIMES DAILY. 06/15/16   Lewayne Bunting, MD  simvastatin (ZOCOR) 40 MG tablet TAKE 1 TABLET (40 MG TOTAL) BY MOUTH AT BEDTIME. 06/15/16   Crenshaw, Madolyn Frieze, MD    Family History Family History  Problem Relation Age of Onset  . Stroke Father        CVA  . Heart attack Father   . Heart attack Mother     Social History Social History  Substance Use Topics  . Smoking status: Never Smoker  . Smokeless tobacco: Never Used  . Alcohol use No     Allergies   Penicillins   Review of Systems Review of Systems  Constitutional: Negative for activity change and fever.  Eyes: Negative for visual disturbance.  Respiratory: Negative for cough and shortness of breath.   Cardiovascular: Negative for chest pain.  Gastrointestinal: Positive for nausea. Negative for vomiting.  Genitourinary: Negative for dysuria.  Musculoskeletal: Negative for back pain.  Skin: Negative for rash.  Neurological: Positive for light-headedness. Negative for dizziness, syncope, weakness and headaches.  All other systems reviewed and are negative.    Physical Exam Updated Vital Signs BP (!) 143/86 (BP Location: Right Arm)   Pulse 69   Temp 98 F (36.7 C) (Oral)   Resp 16   SpO2 98%   Physical Exam  Constitutional: He is oriented to person, place, and time. He appears well-nourished.  HENT:  Head: Normocephalic.  Eyes: Conjunctivae are normal.  Cardiovascular: Normal rate.   Pulmonary/Chest: Effort  normal and breath sounds normal.  Abdominal: Soft. Bowel sounds are normal. He exhibits no distension.  Neurological: He is oriented to person, place, and time. No cranial nerve deficit.  Equal strength bilaterally upper and lower extremities negative pronator drift. Normal sensation bilaterally. Speech comprehensible, no slurring. Facial nerve tested and appears grossly normal. Alert and oriented 3.  Finger to nose normal, Ataxic gait.    Skin: Skin is warm and dry. He is not diaphoretic.  Psychiatric: He has a normal mood and affect.  His behavior is normal.     ED Treatments / Results  Labs (all labs ordered are listed, but only abnormal results are displayed) Labs Reviewed  BASIC METABOLIC PANEL - Abnormal; Notable for the following:       Result Value   Glucose, Bld 119 (*)    Calcium 8.7 (*)    All other components within normal limits  CBC - Abnormal; Notable for the following:    WBC 11.8 (*)    All other components within normal limits  I-STAT TROPONIN, ED  I-STAT BETA HCG BLOOD, ED ([facility], [facility], [facility] ONLY)    EKG  EKG Interpretation  Date/Time:  Monday January 08 2017 14:04:42 EDT Ventricular Rate:  68 PR Interval:  156 QRS Duration: 106 QT Interval:  434 QTC Calculation: 461 R Axis:   20 Text Interpretation:  Normal sinus rhythm with sinus arrhythmia Possible Lateral infarct , age undetermined Abnormal ECG Normal sinus rhythm Confirmed by Corlis Leak, Tahiri Shareef ([id number]) on 01/08/2017 2:37:07 PM       Radiology No results found.  Procedures Procedures (including critical care time)  Medications Ordered in ED Medications  sodium chloride 0.9 % bolus 1,000 mL (1,000 mLs Intravenous New Bag/Given 01/08/17 1426)     Initial Impression / Assessment and Plan / ED Course  I have reviewed the triage vital signs and the nursing notes.  Pertinent labs & imaging results that were available during my care of the patient were reviewed by me and considered in my medical decision making (see chart for details).    Patient 66 year old male past medical history significant for CAD hypertension hyperlipidemia presenting today with orthostatic dizziness. Patient reports that he went to a meeting today and when he stood up he abruptly felt unsteady. Denies chest pain shortness of breath. He just reports he was unsteady. He sat down and felt much better. He stood up another time and felt the same symptoms again. Patient reports a rocking him to come to an EMS Center where he felt completely fine. They brought him here to  the emergency department. He reports the symptoms are completely gone now. Orthostatic vital signs were checked and he said he was asymptomatic during that time.  Wife reports that he has had a cough for the last 2 weeks. Otherwise report eating and drinking normally.  2:56 PM We'll do lab work, given liter fluid. Thinks this represents orthostatic dizziness. + risk factors for stroke including CAD, HTN HLD. Patient has no risk factors for pulmonary embolism and vital signs are normal. Patient had no chest pain therefore do not suspect ischemic cardiac cause.  After fluids, patent still has the slightistataxia on walking. Finger to nose normal. MRI ordered  4:49 PM  Symptoms are completely resolved. Patient does not want to do MRI. I think this is reasonable decision will have him follow-up with an outpatient. Discussed the fact that we might be missing a stroke, family (his wife) in agreement with discharge home. Patient has  steady ambulation, likely this orthostatic dizziness.  Final Clinical Impressions(s) / ED Diagnoses   Final diagnoses:  None    New Prescriptions New Prescriptions   No medications on file     Abelino Derrick, MD 01/08/17 1651

## 2017-01-08 NOTE — ED Notes (Signed)
Pt returned from xray

## 2017-01-08 NOTE — ED Notes (Signed)
Patient transported to X-ray 

## 2017-01-08 NOTE — ED Notes (Signed)
Pt denies dizziness at this time.

## 2017-01-08 NOTE — ED Triage Notes (Signed)
Pt presents for evaluation of dizziness around 0930 today. Pt states he felt fine prior to event, was in meeting and went to stand up when he felt acutely dizzy. Pt reports resolved at this time.

## 2017-01-08 NOTE — ED Notes (Signed)
Advised pt and family that MD would like pt to wait to eat or drink anything until MRI is completed

## 2017-01-08 NOTE — Discharge Instructions (Signed)
Please drink plenty of fluids, follow-up with your primary care physician. Return with worsening symptoms, or other concerns.

## 2017-01-08 NOTE — ED Notes (Signed)
Pt stable, ambulatory, states understanding of discharge instructions 

## 2017-01-15 LAB — I-STAT BETA HCG BLOOD, ED (MC, WL, AP ONLY)

## 2017-01-15 LAB — I-STAT TROPONIN, ED: Troponin i, poc: 0 ng/mL (ref 0.00–0.08)

## 2017-01-26 LAB — BASIC METABOLIC PANEL
BUN/Creatinine Ratio: 11 (ref 10–24)
BUN: 10 mg/dL (ref 8–27)
CALCIUM: 8.8 mg/dL (ref 8.6–10.2)
CHLORIDE: 106 mmol/L (ref 96–106)
CO2: 23 mmol/L (ref 20–29)
CREATININE: 0.87 mg/dL (ref 0.76–1.27)
GFR, EST AFRICAN AMERICAN: 105 mL/min/{1.73_m2} (ref >59)
GFR, EST NON AFRICAN AMERICAN: 91 mL/min/{1.73_m2} (ref >59)
Glucose: 102 mg/dL — ABNORMAL HIGH (ref 65–99)
Potassium: 4.5 mmol/L (ref 3.5–5.2)
Sodium: 143 mmol/L (ref 134–144)

## 2017-01-26 LAB — LIPID PANEL
CHOL/HDL RATIO: 3.3 ratio (ref 0.0–5.0)
Cholesterol, Total: 134 mg/dL (ref 100–199)
HDL: 41 mg/dL (ref >39)
LDL CALC: 79 mg/dL (ref 0–99)
Triglycerides: 70 mg/dL (ref 0–149)
VLDL CHOLESTEROL CAL: 14 mg/dL (ref 5–40)

## 2017-01-29 ENCOUNTER — Encounter: Payer: Self-pay | Admitting: *Deleted

## 2017-06-10 ENCOUNTER — Other Ambulatory Visit: Payer: Self-pay | Admitting: Cardiology

## 2017-06-12 NOTE — Telephone Encounter (Signed)
Rx(s) sent to pharmacy electronically.  

## 2018-02-06 IMAGING — CR DG CHEST 2V
2 series · 2 of 2 positions shown · non-contrast
Comparison: None.

CLINICAL DATA: Chest pain

EXAM:
CHEST  2 VIEW

[chest pa]
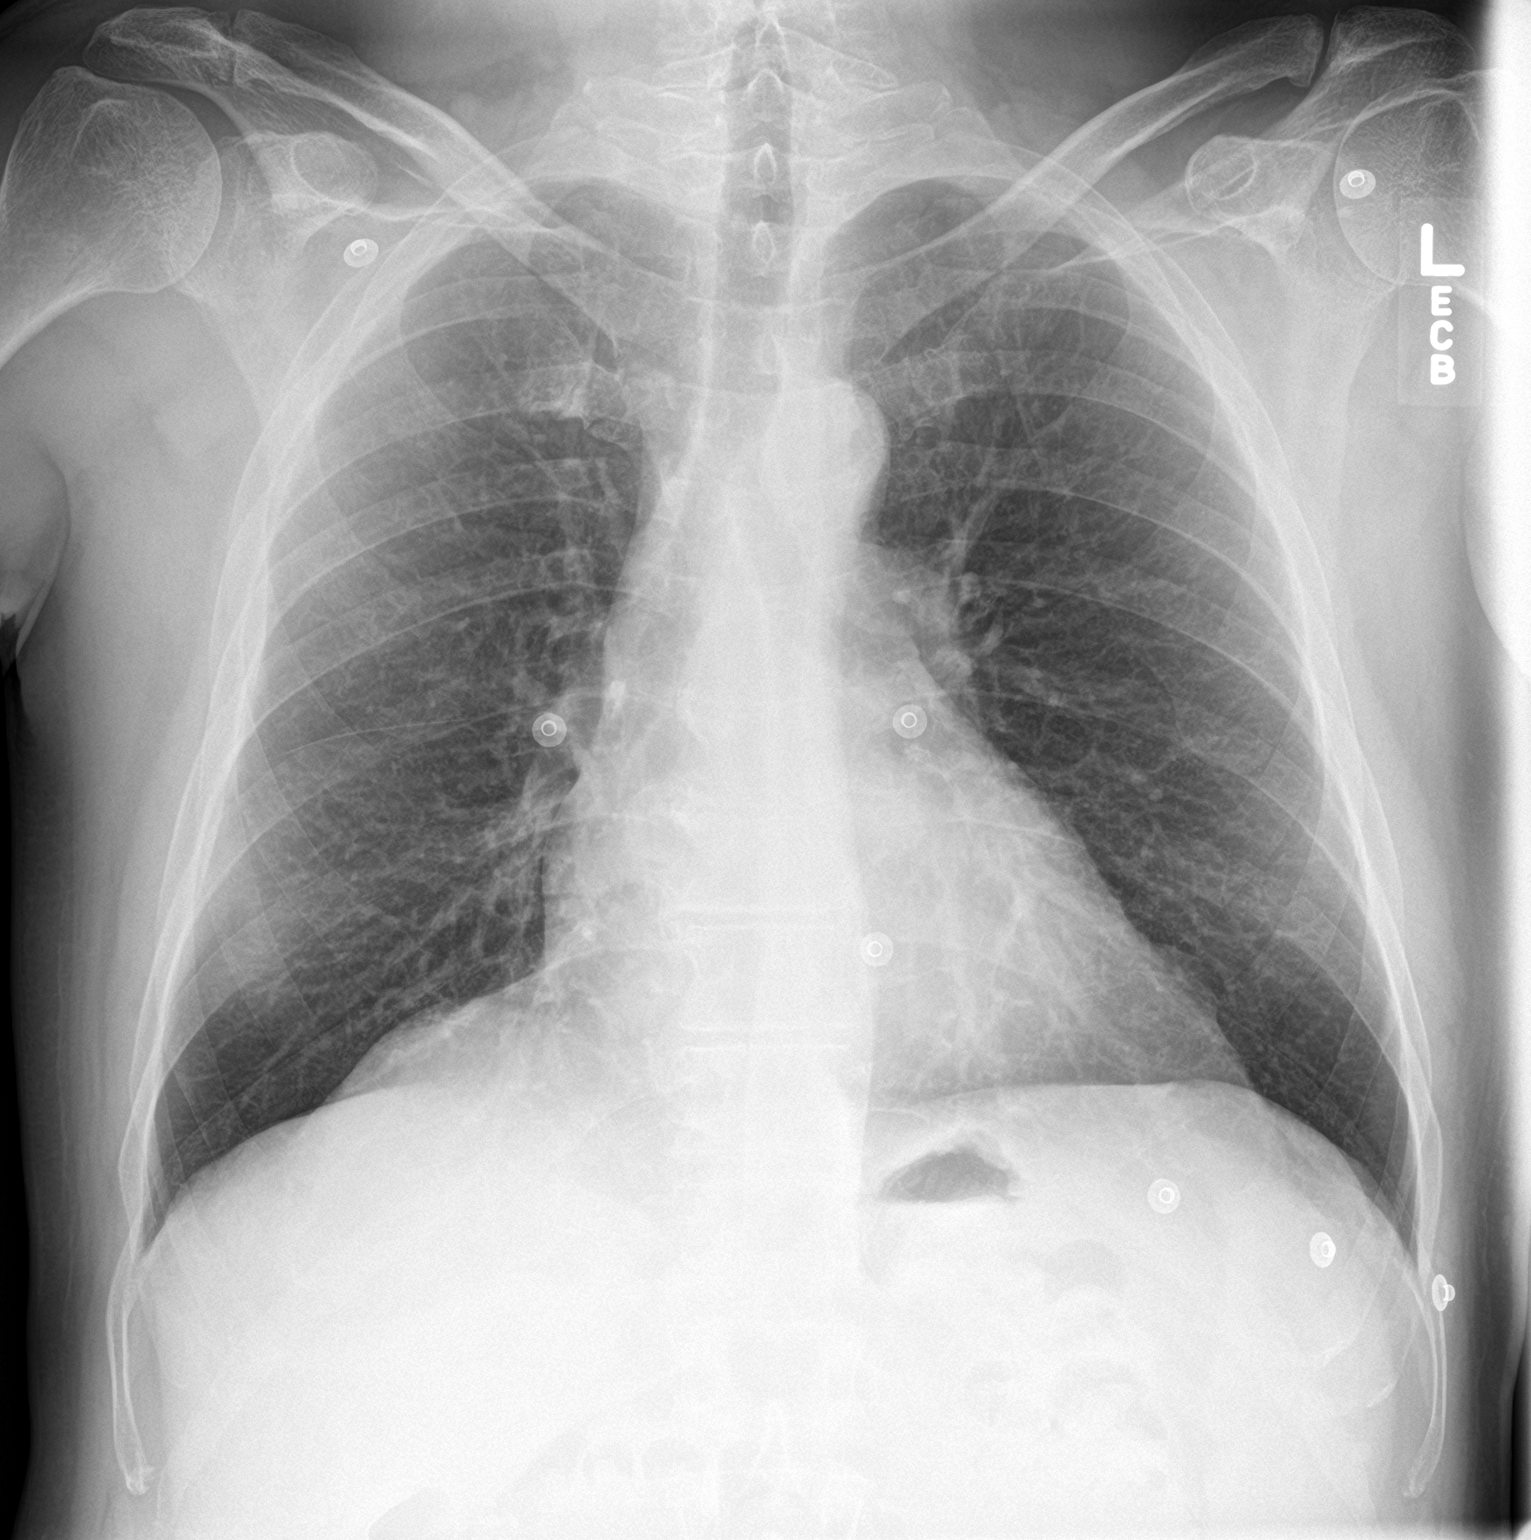

[chest lat]
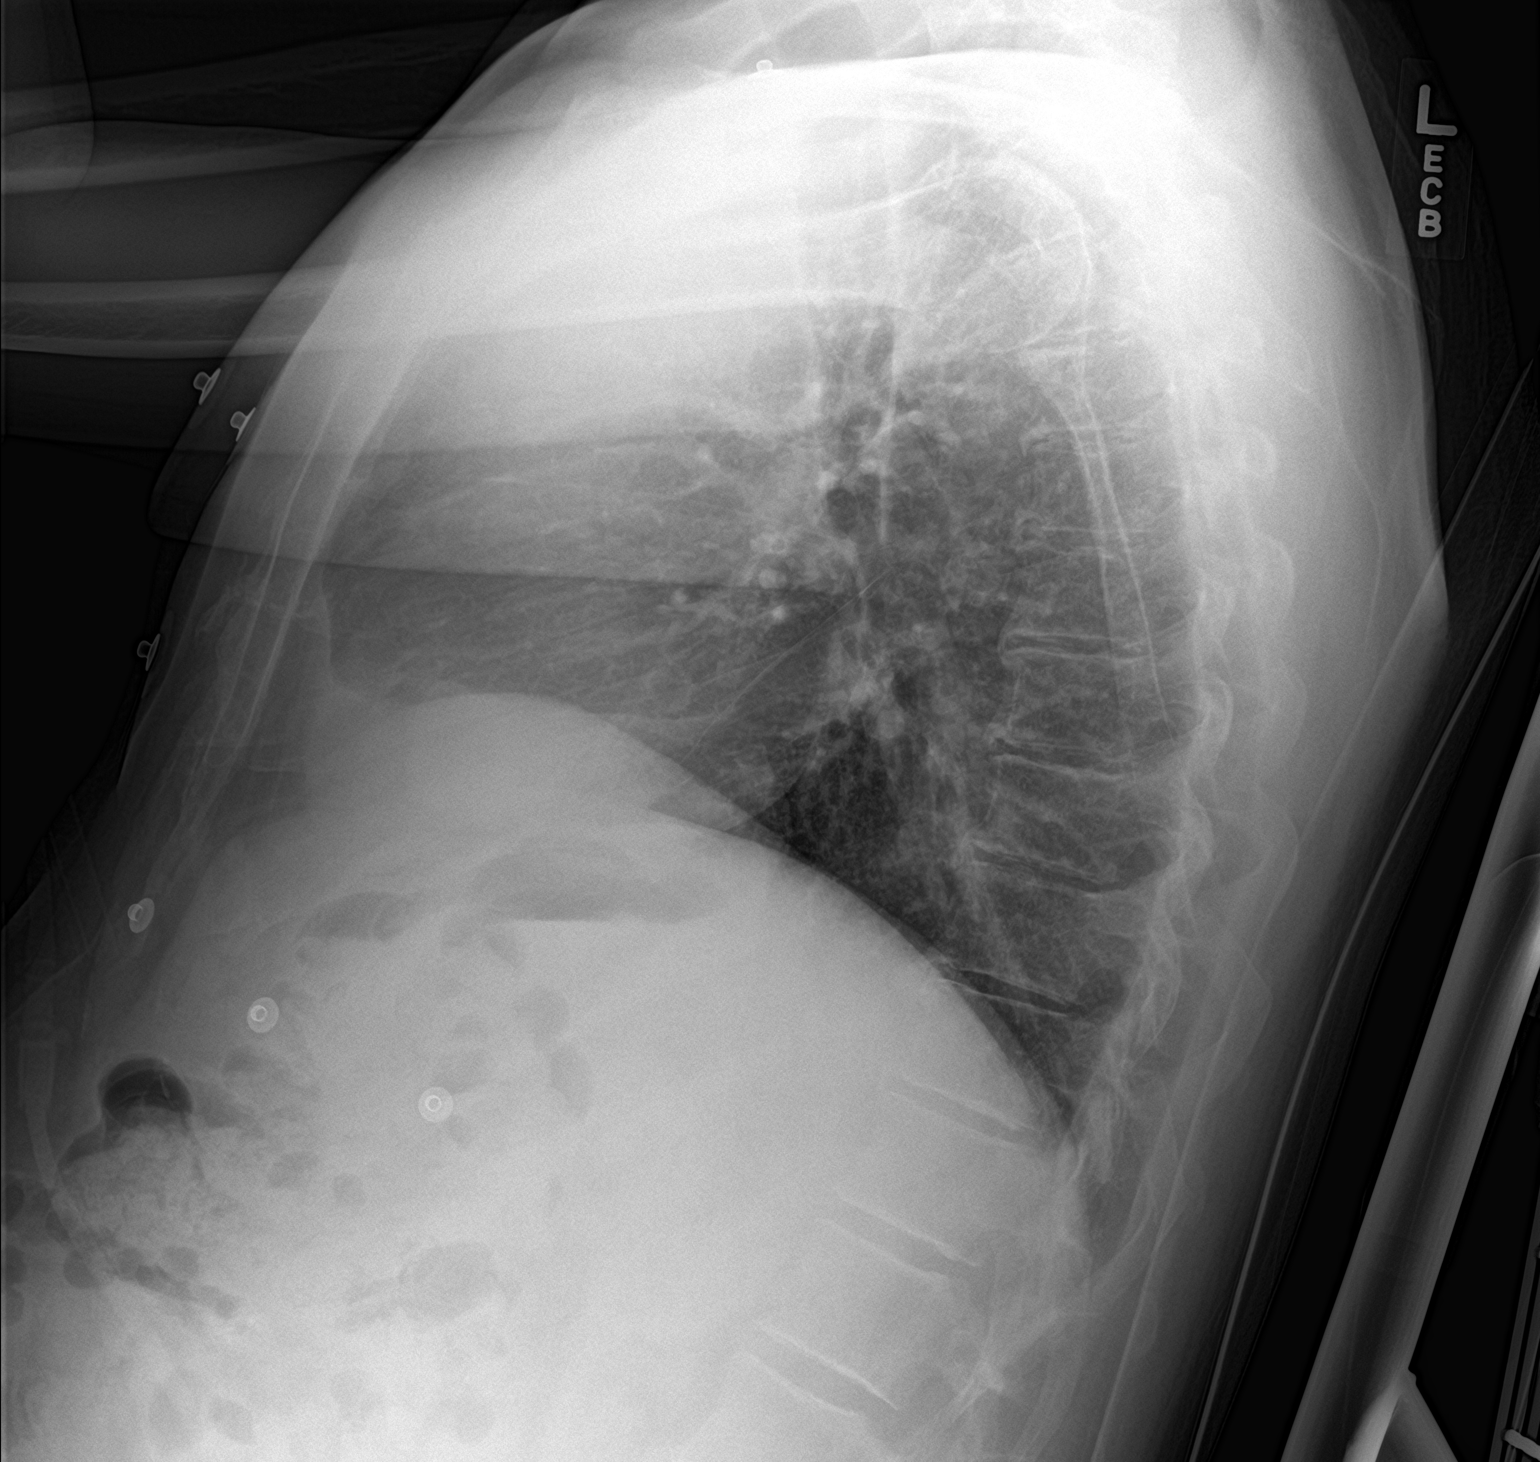

[2 of 2 positions shown; findings below may reference images not displayed]

FINDINGS: Normal heart size. Normal mediastinal contour. No pneumothorax. No
pleural effusion. Lungs appear clear, with no acute consolidative
airspace disease and no pulmonary edema.
IMPRESSION: No active cardiopulmonary disease.

## 2018-04-02 DIAGNOSIS — Z23 Encounter for immunization: Secondary | ICD-10-CM | POA: Diagnosis not present

## 2018-04-17 DIAGNOSIS — R030 Elevated blood-pressure reading, without diagnosis of hypertension: Secondary | ICD-10-CM | POA: Diagnosis not present

## 2018-04-17 DIAGNOSIS — J111 Influenza due to unidentified influenza virus with other respiratory manifestations: Secondary | ICD-10-CM | POA: Diagnosis not present

## 2018-04-26 NOTE — Progress Notes (Signed)
HPI: FU coronary disease status post PCI of his LAD in July 1999. Patient has declined function studies in the past. Since I last saw him, the patient denies any dyspnea on exertion, orthopnea, PND, pedal edema, palpitations, syncope or chest pain.   Current Outpatient Medications  Medication Sig Dispense Refill  . aspirin 81 MG tablet Take 81 mg by mouth daily.     Marland Kitchen lisinopril (PRINIVIL,ZESTRIL) 20 MG tablet TAKE ONE TABLET BY MOUTH ONE TIME DAILY 90 tablet 3  . metoprolol tartrate (LOPRESSOR) 25 MG tablet TAKE 1 TABLET (25 MG TOTAL) BY MOUTH 2 (TWO) TIMES DAILY. 180 tablet 3  . simvastatin (ZOCOR) 40 MG tablet TAKE 1 TABLET (40 MG TOTAL) BY MOUTH AT BEDTIME. 90 tablet 3   No current facility-administered medications for this visit.      Past Medical History:  Diagnosis Date  . Coronary artery disease   . Hyperlipidemia   . Hypertension     Past Surgical History:  Procedure Laterality Date  . CORONARY ANGIOPLASTY WITH STENT PLACEMENT     percutaneous coronary intervention (PCI) of LAD    Social History   Socioeconomic History  . Marital status: Single    Spouse name: Not on file  . Number of children: Not on file  . Years of education: Not on file  . Highest education level: Not on file  Occupational History  . Not on file  Social Needs  . Financial resource strain: Not on file  . Food insecurity:    Worry: Not on file    Inability: Not on file  . Transportation needs:    Medical: Not on file    Non-medical: Not on file  Tobacco Use  . Smoking status: Never Smoker  . Smokeless tobacco: Never Used  Substance and Sexual Activity  . Alcohol use: No  . Drug use: No  . Sexual activity: Not on file  Lifestyle  . Physical activity:    Days per week: Not on file    Minutes per session: Not on file  . Stress: Not on file  Relationships  . Social connections:    Talks on phone: Not on file    Gets together: Not on file    Attends religious service: Not  on file    Active member of club or organization: Not on file    Attends meetings of clubs or organizations: Not on file    Relationship status: Not on file  . Intimate partner violence:    Fear of current or ex partner: Not on file    Emotionally abused: Not on file    Physically abused: Not on file    Forced sexual activity: Not on file  Other Topics Concern  . Not on file  Social History Narrative  . Not on file    Family History  Problem Relation Age of Onset  . Stroke Father        CVA  . Heart attack Father   . Heart attack Mother     ROS: no fevers or chills, productive cough, hemoptysis, dysphasia, odynophagia, melena, hematochezia, dysuria, hematuria, rash, seizure activity, orthopnea, PND, pedal edema, claudication. Remaining systems are negative.  Physical Exam: Well-developed well-nourished in no acute distress.  Skin is warm and dry.  HEENT is normal.  Neck is supple.  Chest is clear to auscultation with normal expansion.  Cardiovascular exam is regular rate and rhythm.  Abdominal exam nontender or distended. No masses palpated. Extremities  show no edema. neuro grossly intact  ECG-sinus bradycardia at a rate of 56.  No ST changes.  Personally reviewed  A/P  1 coronary artery disease-patient denies chest pain or dyspnea.  Plan to continue medical therapy with aspirin and statin.  He does not want follow-up functional studies.  2 hypertension-patient's blood pressure is mildly elevated; I have asked him to follow this at home and we will increase lisinopril if needed.  Check potassium and renal function.  3 hyperlipidemia-continue Zocor.  Declined high-dose statins previously.  Check lipids and liver.  Olga Millers, MD

## 2018-04-30 ENCOUNTER — Ambulatory Visit (INDEPENDENT_AMBULATORY_CARE_PROVIDER_SITE_OTHER): Payer: 59 | Admitting: Cardiology

## 2018-04-30 ENCOUNTER — Encounter: Payer: Self-pay | Admitting: Cardiology

## 2018-04-30 ENCOUNTER — Encounter (INDEPENDENT_AMBULATORY_CARE_PROVIDER_SITE_OTHER): Payer: Self-pay

## 2018-04-30 VITALS — BP 138/64 | HR 56 | Ht 70.0 in | Wt 180.0 lb

## 2018-04-30 DIAGNOSIS — E785 Hyperlipidemia, unspecified: Secondary | ICD-10-CM

## 2018-04-30 DIAGNOSIS — I10 Essential (primary) hypertension: Secondary | ICD-10-CM

## 2018-04-30 DIAGNOSIS — I2510 Atherosclerotic heart disease of native coronary artery without angina pectoris: Secondary | ICD-10-CM | POA: Diagnosis not present

## 2018-04-30 NOTE — Patient Instructions (Signed)
Medication Instructions:  NO CHANGE If you need a refill on your cardiac medications before your next appointment, please call your pharmacy.   Lab work: Your physician recommends that you return for lab work PRIOR TO EATING If you have labs (blood work) drawn today and your tests are completely normal, you will receive your results only by: Marland Kitchen MyChart Message (if you have MyChart) OR . A paper copy in the mail If you have any lab test that is abnormal or we need to change your treatment, we will call you to review the results.  Follow-Up: At [facility], you and your health needs are our priority.  As part of our continuing mission to provide you with exceptional heart care, we have created designated Provider Care Teams.  These Care Teams include your primary Cardiologist (physician) and Advanced Practice Providers (APPs -  Physician Assistants and Nurse Practitioners) who all work together to provide you with the care you need, when you need it. You will need a follow up appointment in 12 months.  Please call our office 2 months in advance to schedule this appointment.  You may see Olga Millers MD or one of the following Advanced Practice Providers on your designated Care Team:   Corine Shelter, PA-C Judy Pimple, [place] . Marjie Skiff, [place]  CALL IN November TO SCHEDULE APPOINTMENT IN January 2021

## 2018-05-17 DIAGNOSIS — E785 Hyperlipidemia, unspecified: Secondary | ICD-10-CM | POA: Diagnosis not present

## 2018-05-18 LAB — COMPREHENSIVE METABOLIC PANEL
ALBUMIN: 4.2 g/dL (ref 3.8–4.8)
ALT: 20 IU/L (ref 0–44)
AST: 20 IU/L (ref 0–40)
Albumin/Globulin Ratio: 2 (ref 1.2–2.2)
Alkaline Phosphatase: 69 IU/L (ref 39–117)
BUN / CREAT RATIO: 10 (ref 10–24)
BUN: 10 mg/dL (ref 8–27)
Bilirubin Total: 0.5 mg/dL (ref 0.0–1.2)
CALCIUM: 9.2 mg/dL (ref 8.6–10.2)
CO2: 21 mmol/L (ref 20–29)
CREATININE: 0.98 mg/dL (ref 0.76–1.27)
Chloride: 104 mmol/L (ref 96–106)
GFR calc Af Amer: 93 mL/min/{1.73_m2} (ref >59)
GFR, EST NON AFRICAN AMERICAN: 81 mL/min/{1.73_m2} (ref >59)
GLOBULIN, TOTAL: 2.1 g/dL (ref 1.5–4.5)
Glucose: 110 mg/dL — ABNORMAL HIGH (ref 65–99)
Potassium: 4.5 mmol/L (ref 3.5–5.2)
SODIUM: 139 mmol/L (ref 134–144)
Total Protein: 6.3 g/dL (ref 6.0–8.5)

## 2018-05-18 LAB — LIPID PANEL
CHOL/HDL RATIO: 3.6 ratio (ref 0.0–5.0)
CHOLESTEROL TOTAL: 149 mg/dL (ref 100–199)
HDL: 41 mg/dL (ref >39)
LDL CALC: 88 mg/dL (ref 0–99)
Triglycerides: 101 mg/dL (ref 0–149)
VLDL Cholesterol Cal: 20 mg/dL (ref 5–40)

## 2018-05-20 ENCOUNTER — Encounter: Payer: Self-pay | Admitting: *Deleted

## 2018-06-27 ENCOUNTER — Other Ambulatory Visit: Payer: Self-pay | Admitting: Cardiology

## 2019-04-29 ENCOUNTER — Telehealth: Payer: Self-pay | Admitting: *Deleted

## 2019-04-29 NOTE — Telephone Encounter (Signed)
A message was left, re: his office visit.

## 2019-06-24 ENCOUNTER — Other Ambulatory Visit: Payer: Self-pay | Admitting: Cardiology

## 2019-07-11 ENCOUNTER — Telehealth: Payer: Self-pay | Admitting: Cardiology

## 2019-07-11 NOTE — Telephone Encounter (Signed)
Left message for patient to call and schedule 1 yr. Follow up appointment with Dr. Crenshaw. 

## 2019-08-26 ENCOUNTER — Encounter: Payer: Self-pay | Admitting: General Practice

## 2020-01-12 NOTE — Progress Notes (Deleted)
HPI: FU coronary disease status post PCI of his LAD in July 1999. Patient has declined function studies in the past. Since I last saw him,  Current Outpatient Medications  Medication Sig Dispense Refill  . aspirin 81 MG tablet Take 81 mg by mouth daily.     Marland Kitchen lisinopril (ZESTRIL) 20 MG tablet TAKE 1 TABLET BY MOUTH EVERY DAY 90 tablet 3  . metoprolol tartrate (LOPRESSOR) 25 MG tablet TAKE 1 TABLET BY MOUTH TWICE A DAY 180 tablet 3  . simvastatin (ZOCOR) 40 MG tablet TAKE 1 TABLET BY MOUTH EVERYDAY AT BEDTIME 90 tablet 3   No current facility-administered medications for this visit.     Past Medical History:  Diagnosis Date  . Coronary artery disease   . Hyperlipidemia   . Hypertension     Past Surgical History:  Procedure Laterality Date  . CORONARY ANGIOPLASTY WITH STENT PLACEMENT     percutaneous coronary intervention (PCI) of LAD    Social History   Socioeconomic History  . Marital status: Single    Spouse name: Not on file  . Number of children: Not on file  . Years of education: Not on file  . Highest education level: Not on file  Occupational History  . Not on file  Tobacco Use  . Smoking status: Never Smoker  . Smokeless tobacco: Never Used  Substance and Sexual Activity  . Alcohol use: No  . Drug use: No  . Sexual activity: Not on file  Other Topics Concern  . Not on file  Social History Narrative  . Not on file   Social Determinants of Health   Financial Resource Strain:   . Difficulty of Paying Living Expenses: Not on file  Food Insecurity:   . Worried About Programme researcher, broadcasting/film/video in the Last Year: Not on file  . Ran Out of Food in the Last Year: Not on file  Transportation Needs:   . Lack of Transportation (Medical): Not on file  . Lack of Transportation (Non-Medical): Not on file  Physical Activity:   . Days of Exercise per Week: Not on file  . Minutes of Exercise per Session: Not on file  Stress:   . Feeling of Stress : Not on file    Social Connections:   . Frequency of Communication with Friends and Family: Not on file  . Frequency of Social Gatherings with Friends and Family: Not on file  . Attends Religious Services: Not on file  . Active Member of Clubs or Organizations: Not on file  . Attends Banker Meetings: Not on file  . Marital Status: Not on file  Intimate Partner Violence:   . Fear of Current or Ex-Partner: Not on file  . Emotionally Abused: Not on file  . Physically Abused: Not on file  . Sexually Abused: Not on file    Family History  Problem Relation Age of Onset  . Stroke Father        CVA  . Heart attack Father   . Heart attack Mother     ROS: no fevers or chills, productive cough, hemoptysis, dysphasia, odynophagia, melena, hematochezia, dysuria, hematuria, rash, seizure activity, orthopnea, PND, pedal edema, claudication. Remaining systems are negative.  Physical Exam: Well-developed well-nourished in no acute distress.  Skin is warm and dry.  HEENT is normal.  Neck is supple.  Chest is clear to auscultation with normal expansion.  Cardiovascular exam is regular rate and rhythm.  Abdominal exam nontender  or distended. No masses palpated. Extremities show no edema. neuro grossly intact  ECG- personally reviewed  A/P  1 coronary artery disease-patient doing well with no symptoms of chest pain or dyspnea.  Continue aspirin and statin.  He has declined follow-up functional studies in the past.  2 hyperlipidemia-continue simvastatin.  He declined high-dose statins previously.  We will check lipids and liver.  3 hypertension-patient's blood pressure is controlled.  Continue present medications.  Check potassium and renal function.  Olga Millers, MD

## 2020-01-21 ENCOUNTER — Ambulatory Visit: Payer: 59 | Admitting: Cardiology

## 2020-01-26 NOTE — Progress Notes (Signed)
Virtual Visit via Video Note changed to phone visit at patient request.   This visit type was conducted due to national recommendations for restrictions regarding the COVID-19 Pandemic (e.g. social distancing) in an effort to limit this patient's exposure and mitigate transmission in our community.  Due to his co-morbid illnesses, this patient is at least at moderate risk for complications without adequate follow up.  This format is felt to be most appropriate for this patient at this time.  All issues noted in this document were discussed and addressed.  A limited physical exam was performed with this format.  Please refer to the patient's chart for his consent to telehealth for [facility].      Date:  01/30/2020   ID:  Kirk Black, DOB [birth year], MRN [id number]  Patient Location:Home Provider Location: Home  PCP:  Kaleen Mask, MD  Cardiologist:  Dr Jens Som  Evaluation Performed:  Follow-Up Visit  Chief Complaint:  FU CAD  History of Present Illness:    FU coronary disease status post PCI of his LAD in July 1999. Patient has declined function studies in the past. Since I last saw him, the patient denies any dyspnea on exertion, orthopnea, PND, pedal edema, palpitations, syncope or chest pain.   The patient does not have symptoms concerning for COVID-19 infection (fever, chills, cough, or new shortness of breath).    Past Medical History:  Diagnosis Date   Coronary artery disease    Hyperlipidemia    Hypertension    Past Surgical History:  Procedure Laterality Date   CORONARY ANGIOPLASTY WITH STENT PLACEMENT     percutaneous coronary intervention (PCI) of LAD     Current Meds  Medication Sig   aspirin 81 MG tablet Take 81 mg by mouth daily.    lisinopril (ZESTRIL) 20 MG tablet TAKE 1 TABLET BY MOUTH EVERY DAY   metoprolol tartrate (LOPRESSOR) 25 MG tablet TAKE 1 TABLET BY MOUTH TWICE A DAY   simvastatin (ZOCOR) 40 MG tablet TAKE 1 TABLET  BY MOUTH EVERYDAY AT BEDTIME     Allergies:   Penicillins   Social History   Tobacco Use   Smoking status: Never Smoker   Smokeless tobacco: Never Used  Substance Use Topics   Alcohol use: No   Drug use: No     Family Hx: The patient's family history includes Heart attack in his father and mother; Stroke in his father.  ROS:   Please see the history of present illness.    No Fever, chills  or productive cough All other systems reviewed and are negative.   Recent Lipid Panel Lab Results  Component Value Date/Time   CHOL 149 05/17/2018 08:58 AM   TRIG 101 05/17/2018 08:58 AM   HDL 41 05/17/2018 08:58 AM   CHOLHDL 3.6 05/17/2018 08:58 AM   CHOLHDL 3.5 06/25/2015 09:44 AM   LDLCALC 88 05/17/2018 08:58 AM    Wt Readings from Last 3 Encounters:  01/30/20 188 lb (85.3 kg)  04/30/18 180 lb (81.6 kg)  01/05/17 183 lb 6.4 oz (83.2 kg)     Objective:    Vital Signs:  BP 127/73    Pulse (!) 52    Ht 5\' 10"  (1.778 m)    Wt 188 lb (85.3 kg)    BMI 26.98 kg/m    VITAL SIGNS:  reviewed NAD Answers questions appropriately Normal affect Remainder of physical examination not performed (telehealth visit; coronavirus pandemic)  ASSESSMENT & PLAN:    1. Coronary  artery disease-patient has not had recurrent symptoms.  Continue aspirin and statin. 2. Hypertension-blood pressure controlled.  Continue present medications and follow.  Check potassium and renal function. 3. Hyperlipidemia-continue statin.  Patient has declined high-dose statins previously.  Check lipids and liver.  COVID-19 Education: The importance of social distancing was discussed today.  Time:   Today, I have spent 16 minutes with the patient with telehealth technology discussing the above problems.     Medication Adjustments/Labs and Tests Ordered: Current medicines are reviewed at length with the patient today.  Concerns regarding medicines are outlined above.   Tests Ordered: No orders of the  defined types were placed in this encounter.   Medication Changes: No orders of the defined types were placed in this encounter.   Follow Up:  In Person in 1 year(s)  Signed, Olga Millers, MD  01/30/2020 7:52 AM    [facility]

## 2020-01-30 ENCOUNTER — Encounter: Payer: Self-pay | Admitting: Cardiology

## 2020-01-30 ENCOUNTER — Telehealth (INDEPENDENT_AMBULATORY_CARE_PROVIDER_SITE_OTHER): Payer: 59 | Admitting: Cardiology

## 2020-01-30 VITALS — BP 127/73 | HR 52 | Ht 70.0 in | Wt 188.0 lb

## 2020-01-30 DIAGNOSIS — I10 Essential (primary) hypertension: Secondary | ICD-10-CM

## 2020-01-30 DIAGNOSIS — I2510 Atherosclerotic heart disease of native coronary artery without angina pectoris: Secondary | ICD-10-CM

## 2020-01-30 DIAGNOSIS — E785 Hyperlipidemia, unspecified: Secondary | ICD-10-CM | POA: Diagnosis not present

## 2020-01-30 NOTE — Patient Instructions (Signed)

## 2020-02-20 ENCOUNTER — Encounter: Payer: Self-pay | Admitting: *Deleted

## 2020-02-20 LAB — LIPID PANEL
Chol/HDL Ratio: 4.1 ratio (ref 0.0–5.0)
Cholesterol, Total: 151 mg/dL (ref 100–199)
HDL: 37 mg/dL — ABNORMAL LOW (ref >39)
LDL Chol Calc (NIH): 97 mg/dL (ref 0–99)
Triglycerides: 89 mg/dL (ref 0–149)
VLDL Cholesterol Cal: 17 mg/dL (ref 5–40)

## 2020-02-20 LAB — COMPREHENSIVE METABOLIC PANEL
ALT: 21 IU/L (ref 0–44)
AST: 26 IU/L (ref 0–40)
Albumin/Globulin Ratio: 2.1 (ref 1.2–2.2)
Albumin: 4.4 g/dL (ref 3.8–4.8)
Alkaline Phosphatase: 83 IU/L (ref 44–121)
BUN/Creatinine Ratio: 12 (ref 10–24)
BUN: 11 mg/dL (ref 8–27)
Bilirubin Total: 0.5 mg/dL (ref 0.0–1.2)
CO2: 24 mmol/L (ref 20–29)
Calcium: 9.1 mg/dL (ref 8.6–10.2)
Chloride: 102 mmol/L (ref 96–106)
Creatinine, Ser: 0.91 mg/dL (ref 0.76–1.27)
GFR calc Af Amer: 100 mL/min/{1.73_m2} (ref >59)
GFR calc non Af Amer: 87 mL/min/{1.73_m2} (ref >59)
Globulin, Total: 2.1 g/dL (ref 1.5–4.5)
Glucose: 111 mg/dL — ABNORMAL HIGH (ref 65–99)
Potassium: 4.7 mmol/L (ref 3.5–5.2)
Sodium: 139 mmol/L (ref 134–144)
Total Protein: 6.5 g/dL (ref 6.0–8.5)

## 2020-06-15 ENCOUNTER — Other Ambulatory Visit: Payer: Self-pay | Admitting: Cardiology

## 2020-07-11 ENCOUNTER — Other Ambulatory Visit: Payer: Self-pay | Admitting: Cardiology

## 2021-04-14 ENCOUNTER — Other Ambulatory Visit: Payer: Self-pay | Admitting: Cardiology

## 2021-06-13 ENCOUNTER — Telehealth: Payer: Self-pay | Admitting: Cardiology

## 2021-06-13 NOTE — Telephone Encounter (Signed)
? ?  Pt c/o of Chest Pain: STAT if CP now or developed within 24 hours ? ?1. Are you having CP right now? No  ? ?2. Are you experiencing any other symptoms (ex. SOB, nausea, vomiting, sweating)? None  ? ?3. How long have you been experiencing CP? 2-3 weeks ? ?4. Is your CP continuous or coming and going? Coming and going ? ?5. Have you taken Nitroglycerin? No  ? ?Pt said he started getting CP during exertion, he said, he the same feeling before getting a stent. He is suspecting he has blockage again. ? ? ??  ?

## 2021-06-13 NOTE — Telephone Encounter (Signed)
Patient reports exertional chest pain that comes and goes for the past 2-3 weeks. He describes it as a "squeeze" or "pinch" that lasts for a few seconds; it stops when he rests. He stated it is the same pain he had before his stent placement in 1999. Last pm bp was 126/76. While on phone, BP 162/78, P 58. He also reports headache, but doesn't relate it to chest pain. He denies blurred vision with the headaches. Please advise. ?

## 2021-06-13 NOTE — Telephone Encounter (Signed)
Relayed pat conversation with DOD Dr. Percival Spanish. Made appointment for patient with Janan Ridge, PA-C for 3/22. He stated his BP is not accurate, "That's what Dr. Stanford Breed told me". I suggested he purchase a new one for accuracy. Recommended to patient if chest pain worsens or gets more intense before his appointment, for him tot go to the ED. He voiced understanding. ?

## 2021-06-29 ENCOUNTER — Ambulatory Visit: Payer: 59 | Admitting: Physician Assistant

## 2022-04-17 ENCOUNTER — Other Ambulatory Visit: Payer: Self-pay | Admitting: Cardiology

## 2022-04-25 ENCOUNTER — Other Ambulatory Visit: Payer: Self-pay | Admitting: Cardiology

## 2022-05-01 ENCOUNTER — Other Ambulatory Visit: Payer: Self-pay | Admitting: Cardiology

## 2022-05-31 ENCOUNTER — Other Ambulatory Visit: Payer: Self-pay | Admitting: Cardiology

## 2022-06-12 ENCOUNTER — Telehealth: Payer: Self-pay | Admitting: Cardiology

## 2022-06-12 NOTE — Telephone Encounter (Signed)
Returned call to patient and he reports that he is doing well, patient wishes to do a virtual appointment instead of coming to office, advised patient I would forward message over to Dr. Stanford Breed for him to review and advise. Patient verbalized understanding.

## 2022-06-12 NOTE — Telephone Encounter (Signed)
Attempted to return call to patient- left message stating that per Dr. Stanford Breed is okay to do virtual and to call back to get scheduled. Will forward to scheduling team as well.

## 2022-06-12 NOTE — Telephone Encounter (Signed)
Patient is calling requesting a virtual f/u due to doing okay and not wanting to travel in [place] due to the traffic. Please advise.

## 2022-06-23 ENCOUNTER — Other Ambulatory Visit: Payer: Self-pay | Admitting: Cardiology

## 2022-07-11 NOTE — Progress Notes (Signed)
Virtual Visit via Video Note   Because of Kirk Black's co-morbid illnesses, he is at least at moderate risk for complications without adequate follow up.  This format is felt to be most appropriate for this patient at this time.  All issues noted in this document were discussed and addressed.  A limited physical exam was performed with this format.  Please refer to the patient's chart for his consent to telehealth for [facility].      Date:  07/21/2022   ID:  Kirk Black, DOB 03-30-1951, MRN [id number]  Patient Location:Home Provider Location: Home  PCP:  Kaleen Mask, MD  Cardiologist:  Dr Jens Som  Evaluation Performed:  Follow-Up Visit  Chief Complaint:  FU CAD  History of Present Illness:    FU coronary disease status post PCI of his LAD in July 1999. Patient has declined function studies in the past. Since I last saw him, the patient denies any dyspnea on exertion, orthopnea, PND, pedal edema, palpitations, syncope or chest pain.   The patient does not have symptoms concerning for COVID-19 infection (fever, chills, cough, or new shortness of breath).    Past Medical History:  Diagnosis Date   Coronary artery disease    Hyperlipidemia    Hypertension    Past Surgical History:  Procedure Laterality Date   CORONARY ANGIOPLASTY WITH STENT PLACEMENT     percutaneous coronary intervention (PCI) of LAD     Current Meds  Medication Sig   aspirin 81 MG tablet Take 81 mg by mouth daily.    cyanocobalamin (VITAMIN B12) 1000 MCG/ML injection Inject 1,000 mcg into the muscle every 30 (thirty) days.   lisinopril (ZESTRIL) 20 MG tablet TAKE 1 TABLET (20 MG TOTAL) BY MOUTH DAILY. SCHEDULE OFFICE VISIT FOR FUTURE REFILLS.   metoprolol tartrate (LOPRESSOR) 25 MG tablet Take 1 tablet (25 mg total) by mouth 2 (two) times daily.   nitrofurantoin, macrocrystal-monohydrate, (MACROBID) 100 MG capsule Take 100 mg by mouth every 12 (twelve) hours.    simvastatin (ZOCOR) 10 MG tablet TAKE 1 TABLET (10 MG TOTAL) BY MOUTH DAILY AT 6 PM. SCHEDULE OFFICE VISIT FOR FUTURE REFILLS.   tamsulosin (FLOMAX) 0.4 MG CAPS capsule Take 0.4 mg by mouth daily.     Allergies:   Penicillins   Social History   Tobacco Use   Smoking status: Never   Smokeless tobacco: Never  Substance Use Topics   Alcohol use: No   Drug use: No     Family Hx: The patient's family history includes Heart attack in his father and mother; Stroke in his father.  ROS:   Please see the history of present illness.    No Fever, chills  or productive cough All other systems reviewed and are negative.   Recent Lipid Panel Lab Results  Component Value Date/Time   CHOL 151 02/19/2020 08:37 AM   TRIG 89 02/19/2020 08:37 AM   HDL 37 (L) 02/19/2020 08:37 AM   CHOLHDL 4.1 02/19/2020 08:37 AM   CHOLHDL 3.5 06/25/2015 09:44 AM   LDLCALC 97 02/19/2020 08:37 AM    Wt Readings from Last 3 Encounters:  07/21/22 196 lb (88.9 kg)  01/30/20 188 lb (85.3 kg)  04/30/18 180 lb (81.6 kg)     Objective:    Vital Signs:  BP 135/73   Pulse (!) 55   Ht  (1.778 m)   Wt 196 lb (88.9 kg)   BMI 28.12 kg/m    VITAL SIGNS:  reviewed NAD Answers questions appropriately Normal affect Remainder of physical examination not performed (telehealth visit; coronavirus pandemic)  ASSESSMENT & PLAN:    Coronary artery disease-patient denies chest pain.  Continue aspirin and statin. Hyperlipidemia-continue statin.  He declines higher doses. Hypertension-patient's blood pressure is controlled by his report.  Continue present medical regimen.  COVID-19 Education: The importance of social distancing was discussed today.  Time:   Today, I have spent 16 minutes with the patient with telehealth technology discussing the above problems.     Medication Adjustments/Labs and Tests Ordered: Current medicines are reviewed at length with the patient today.  Concerns regarding medicines  are outlined above.   Tests Ordered: No orders of the defined types were placed in this encounter.   Medication Changes: No orders of the defined types were placed in this encounter.   Follow Up:  In Person in 1 year(s)  Signed, Olga Millers, MD  07/21/2022 7:43 AM    [facility]

## 2022-07-19 ENCOUNTER — Other Ambulatory Visit: Payer: Self-pay | Admitting: Cardiology

## 2022-07-21 ENCOUNTER — Encounter: Payer: Self-pay | Admitting: Cardiology

## 2022-07-21 ENCOUNTER — Ambulatory Visit: Payer: Medicare Other | Attending: Cardiology | Admitting: Cardiology

## 2022-07-21 VITALS — BP 135/73 | HR 55 | Ht 70.0 in | Wt 196.0 lb

## 2022-07-21 DIAGNOSIS — E7800 Pure hypercholesterolemia, unspecified: Secondary | ICD-10-CM

## 2022-07-21 DIAGNOSIS — I10 Essential (primary) hypertension: Secondary | ICD-10-CM | POA: Diagnosis not present

## 2022-07-21 DIAGNOSIS — I2510 Atherosclerotic heart disease of native coronary artery without angina pectoris: Secondary | ICD-10-CM | POA: Diagnosis not present

## 2022-07-21 NOTE — Patient Instructions (Signed)
    Follow-Up: At [facility], you and your health needs are our priority.  As part of our continuing mission to provide you with exceptional heart care, we have created designated Provider Care Teams.  These Care Teams include your primary Cardiologist (physician) and Advanced Practice Providers (APPs -  Physician Assistants and Nurse Practitioners) who all work together to provide you with the care you need, when you need it.  We recommend signing up for the patient portal called "MyChart".  Sign up information is provided on this After Visit Summary.  MyChart is used to connect with patients for Virtual Visits (Telemedicine).  Patients are able to view lab/test results, encounter notes, upcoming appointments, etc.  Non-urgent messages can be sent to your provider as well.   To learn more about what you can do with MyChart, go to [email or web address].    Your next appointment:   12 month(s)  Provider:   Brian Crenshaw, MD     

## 2022-10-20 ENCOUNTER — Other Ambulatory Visit: Payer: Self-pay | Admitting: Cardiology

## 2022-10-30 ENCOUNTER — Other Ambulatory Visit: Payer: Self-pay | Admitting: Cardiology

## 2023-02-24 ENCOUNTER — Other Ambulatory Visit: Payer: Self-pay | Admitting: Cardiology

## 2023-05-17 ENCOUNTER — Other Ambulatory Visit: Payer: Self-pay | Admitting: Cardiology

## 2023-07-20 ENCOUNTER — Other Ambulatory Visit: Payer: Self-pay | Admitting: Cardiology

## 2023-08-12 ENCOUNTER — Other Ambulatory Visit: Payer: Self-pay | Admitting: Cardiology

## 2023-08-16 ENCOUNTER — Other Ambulatory Visit: Payer: Self-pay | Admitting: Cardiology

## 2023-08-27 ENCOUNTER — Other Ambulatory Visit: Payer: Self-pay | Admitting: Cardiology

## 2023-08-28 NOTE — Progress Notes (Signed)
 Virtual Visit via Video Note   Because of Kirk Black's co-morbid illnesses, he is at least at moderate risk for complications without adequate follow up.  This format is felt to be most appropriate for this patient at this time.  All issues noted in this document were discussed and addressed.  A limited physical exam was performed with this format.  Please refer to the patient's chart for his consent to telehealth for [facility].      Date:  09/10/2023   ID:  Kirk Black, DOB Jan 12, 1951, MRN [id number]  Patient Location:Home Provider Location: Home  PCP:  Candiss Chamorro, MD  Cardiologist:  Dr Audery Blazing  Evaluation Performed:  Follow-Up Visit  Chief Complaint:  FU CAD  History of Present Illness:    HPI: FU coronary disease status post PCI of his LAD in July 1999. Patient has declined function studies in the past. Since I last saw him, the patient denies any dyspnea on exertion, orthopnea, PND, pedal edema, palpitations, syncope or chest pain.   The patient does not have symptoms concerning for COVID-19 infection (fever, chills, cough, or new shortness of breath).    Past Medical History:  Diagnosis Date   Coronary artery disease    Hyperlipidemia    Hypertension    Past Surgical History:  Procedure Laterality Date   CORONARY ANGIOPLASTY WITH STENT PLACEMENT     percutaneous coronary intervention (PCI) of LAD     Current Meds  Medication Sig   aspirin 81 MG tablet Take 81 mg by mouth daily.    cyanocobalamin (VITAMIN B12) 1000 MCG/ML injection Inject 1,000 mcg into the muscle every 30 (thirty) days.   lisinopril  (ZESTRIL ) 20 MG tablet TAKE 1 TABLET BY MOUTH EVERY DAY   metoprolol  tartrate (LOPRESSOR ) 25 MG tablet TAKE 1 TABLET BY MOUTH TWICE A DAY   nitrofurantoin, macrocrystal-monohydrate, (MACROBID) 100 MG capsule Take 100 mg by mouth every 12 (twelve) hours.   simvastatin  (ZOCOR ) 10 MG tablet TAKE 1 TABLET (10 MG TOTAL) BY MOUTH DAILY AT  6 PM.   tamsulosin (FLOMAX) 0.4 MG CAPS capsule Take 0.4 mg by mouth daily.     Allergies:   Penicillins   Social History   Tobacco Use   Smoking status: Never   Smokeless tobacco: Never  Substance Use Topics   Alcohol use: No   Drug use: No     Family Hx: The patient's family history includes Heart attack in his father and mother; Stroke in his father.  ROS:   Please see the history of present illness.    No Fever, chills  or productive cough All other systems reviewed and are negative.     Recent Lipid Panel Lab Results  Component Value Date/Time   CHOL 151 02/19/2020 08:37 AM   TRIG 89 02/19/2020 08:37 AM   HDL 37 (L) 02/19/2020 08:37 AM   CHOLHDL 4.1 02/19/2020 08:37 AM   CHOLHDL 3.5 06/25/2015 09:44 AM   LDLCALC 97 02/19/2020 08:37 AM    Wt Readings from Last 3 Encounters:  09/10/23 192 lb (87.1 kg)  07/21/22 196 lb (88.9 kg)  01/30/20 188 lb (85.3 kg)     Objective:    Vital Signs:  BP 121/71   Pulse 72   Ht 5\' 10"  (1.778 m)   Wt 192 lb (87.1 kg)   BMI 27.55 kg/m    NAD Answers questions appropriately Normal affect Remainder of physical examination not performed (telehealth visit; coronavirus pandemic)  ASSESSMENT &  PLAN:    1 coronary artery disease-patient continues to do well from a symptomatic standpoint.  Continue medical therapy with aspirin and statin.  2 hypertension-patient's blood pressure is controlled.  Continue present medications.  3 hyperlipidemia-continue statin.  He has previously declined higher doses.  COVID-19 Education: The importance of social distancing was discussed today.  Time:   Today, I have spent 14 minutes with the patient with telehealth technology discussing the above problems.     Medication Adjustments/Labs and Tests Ordered: Current medicines are reviewed at length with the patient today.  Concerns regarding medicines are outlined above.   Tests Ordered: No orders of the defined types were placed in  this encounter.   Medication Changes: No orders of the defined types were placed in this encounter.   Follow Up:  In Person in 1 year(s)  Signed, Alexandria Angel, MD  09/10/2023 8:39 AM    [facility]

## 2023-08-31 ENCOUNTER — Telehealth: Payer: Self-pay | Admitting: Cardiology

## 2023-08-31 NOTE — Telephone Encounter (Signed)
 Pt would like to know if his upcoming appt on 6/2 can be changed to virtual. Requesting cb

## 2023-08-31 NOTE — Telephone Encounter (Signed)
 Please advise if change to virtual visit is okay.

## 2023-09-04 NOTE — Telephone Encounter (Signed)
 Spoke with pt, Kirk Black of dr Lourdes Roy recommendations.  Appointment changed.

## 2023-09-10 ENCOUNTER — Ambulatory Visit: Attending: Cardiology | Admitting: Cardiology

## 2023-09-10 ENCOUNTER — Encounter: Payer: Self-pay | Admitting: Cardiology

## 2023-09-10 VITALS — BP 121/71 | HR 72 | Ht 70.0 in | Wt 192.0 lb

## 2023-09-10 DIAGNOSIS — I10 Essential (primary) hypertension: Secondary | ICD-10-CM | POA: Diagnosis not present

## 2023-09-10 DIAGNOSIS — I2510 Atherosclerotic heart disease of native coronary artery without angina pectoris: Secondary | ICD-10-CM | POA: Diagnosis present

## 2023-09-10 DIAGNOSIS — E7800 Pure hypercholesterolemia, unspecified: Secondary | ICD-10-CM

## 2023-09-10 NOTE — Patient Instructions (Signed)
 Medication Instructions:  Your physician recommends that you continue on your current medications as directed. Please refer to the Current Medication list given to you today.    *If you need a refill on your cardiac medications before your next appointment, please call your pharmacy*   Lab Work: NONE    If you have labs (blood work) drawn today and your tests are completely normal, you will receive your results only by: MyChart Message (if you have MyChart) OR A paper copy in the mail If you have any lab test that is abnormal or we need to change your treatment, we will call you to review the results.   Testing/Procedures: NONE    Follow-Up: At [facility], you and your health needs are our priority.  As part of our continuing mission to provide you with exceptional heart care, we have created designated Provider Care Teams.  These Care Teams include your primary Cardiologist (physician) and Advanced Practice Providers (APPs -  Physician Assistants and Nurse Practitioners) who all work together to provide you with the care you need, when you need it.  We recommend signing up for the patient portal called "MyChart".  Sign up information is provided on this After Visit Summary.  MyChart is used to connect with patients for Virtual Visits (Telemedicine).  Patients are able to view lab/test results, encounter notes, upcoming appointments, etc.  Non-urgent messages can be sent to your provider as well.   To learn more about what you can do with MyChart, go to [email or web address].    Your next appointment:   1 year(s)  The format for your next appointment:   In Person  Provider:   Alexandria Angel, MD   Other Instructions

## 2023-09-21 ENCOUNTER — Other Ambulatory Visit: Payer: Self-pay | Admitting: Cardiology
